# Patient Record
Sex: Male | Born: 1960 | Race: Black or African American | Hispanic: No | Smoking: Current every day smoker
Health system: Southern US, Community
[De-identification: ages and names within clinical notes are randomized; demographics above are authoritative.]

## PROBLEM LIST (undated history)

## (undated) HISTORY — PX: HAND SURGERY: SHX662

---

## 2020-05-24 DIAGNOSIS — K5720 Diverticulitis of large intestine with perforation and abscess without bleeding: Secondary | ICD-10-CM

## 2020-05-24 HISTORY — DX: Diverticulitis of large intestine with perforation and abscess without bleeding: K57.20

## 2020-06-03 ENCOUNTER — Encounter (HOSPITAL_COMMUNITY): Payer: Self-pay

## 2020-06-03 ENCOUNTER — Inpatient Hospital Stay (HOSPITAL_COMMUNITY)
Admission: EM | Admit: 2020-06-03 | Discharge: 2020-06-06 | DRG: 392 | Disposition: A | Discharge status: Home / Self Care | Payer: PRIVATE HEALTH INSURANCE | Attending: General Surgery | Admitting: General Surgery

## 2020-06-03 ENCOUNTER — Emergency Department (HOSPITAL_COMMUNITY): Payer: PRIVATE HEALTH INSURANCE

## 2020-06-03 ENCOUNTER — Inpatient Hospital Stay (HOSPITAL_COMMUNITY): Payer: PRIVATE HEALTH INSURANCE

## 2020-06-03 ENCOUNTER — Other Ambulatory Visit: Payer: Self-pay

## 2020-06-03 DIAGNOSIS — N179 Acute kidney failure, unspecified: Secondary | ICD-10-CM | POA: Diagnosis present

## 2020-06-03 DIAGNOSIS — K56600 Partial intestinal obstruction, unspecified as to cause: Secondary | ICD-10-CM | POA: Diagnosis present

## 2020-06-03 DIAGNOSIS — K630 Abscess of intestine: Secondary | ICD-10-CM

## 2020-06-03 DIAGNOSIS — Z20822 Contact with and (suspected) exposure to covid-19: Secondary | ICD-10-CM | POA: Diagnosis present

## 2020-06-03 DIAGNOSIS — K5720 Diverticulitis of large intestine with perforation and abscess without bleeding: Secondary | ICD-10-CM | POA: Diagnosis present

## 2020-06-03 DIAGNOSIS — F17210 Nicotine dependence, cigarettes, uncomplicated: Secondary | ICD-10-CM | POA: Diagnosis present

## 2020-06-03 LAB — URINALYSIS, MICROSCOPIC (REFLEX)

## 2020-06-03 LAB — URINALYSIS, ROUTINE W REFLEX MICROSCOPIC
Glucose, UA: 100 mg/dL — AB
Ketones, ur: NEGATIVE mg/dL
Leukocytes,Ua: NEGATIVE
Nitrite: NEGATIVE
Protein, ur: 100 mg/dL — AB
Specific Gravity, Urine: >1.03 — ABNORMAL HIGH (ref 1.005–1.030)
pH: 5 (ref 5.0–8.0)

## 2020-06-03 LAB — CBC
HCT: 49.6 % (ref 39.0–52.0)
Hemoglobin: 16.9 g/dL (ref 13.0–17.0)
MCH: 29.6 pg (ref 26.0–34.0)
MCHC: 34.1 g/dL (ref 30.0–36.0)
MCV: 87 fL (ref 80.0–100.0)
Platelets: 463 10*3/uL — ABNORMAL HIGH (ref 150–400)
RBC: 5.7 MIL/uL (ref 4.22–5.81)
RDW: 14.1 % (ref 11.5–15.5)
WBC: 11.2 10*3/uL — ABNORMAL HIGH (ref 4.0–10.5)
nRBC: 0 % (ref 0.0–0.2)

## 2020-06-03 LAB — SARS CORONAVIRUS 2 (TAT 6-24 HRS): SARS Coronavirus 2: NEGATIVE

## 2020-06-03 LAB — COMPREHENSIVE METABOLIC PANEL
ALT: 29 U/L (ref 0–44)
AST: 29 U/L (ref 15–41)
Albumin: 3.9 g/dL (ref 3.5–5.0)
Alkaline Phosphatase: 46 U/L (ref 38–126)
Anion gap: 18 — ABNORMAL HIGH (ref 5–15)
BUN: 39 mg/dL — ABNORMAL HIGH (ref 6–20)
CO2: 25 mmol/L (ref 22–32)
Calcium: 9.5 mg/dL (ref 8.9–10.3)
Chloride: 92 mmol/L — ABNORMAL LOW (ref 98–111)
Creatinine, Ser: 1.95 mg/dL — ABNORMAL HIGH (ref 0.61–1.24)
GFR, Estimated: 39 mL/min — ABNORMAL LOW (ref >60)
Glucose, Bld: 104 mg/dL — ABNORMAL HIGH (ref 70–99)
Potassium: 4.2 mmol/L (ref 3.5–5.1)
Sodium: 135 mmol/L (ref 135–145)
Total Bilirubin: 1 mg/dL (ref 0.3–1.2)
Total Protein: 8.5 g/dL — ABNORMAL HIGH (ref 6.5–8.1)

## 2020-06-03 LAB — LIPASE, BLOOD: Lipase: 26 U/L (ref 11–51)

## 2020-06-03 MED ORDER — ACETAMINOPHEN 325 MG PO TABS: 650.0000 mg | ORAL_TABLET | Freq: Four times a day (QID) | ORAL | Status: DC | PRN

## 2020-06-03 MED ORDER — PANTOPRAZOLE SODIUM 40 MG IV SOLR
40.0000 mg | Freq: Once | INTRAVENOUS | Status: AC
  Administered 2020-06-03: 40 mg via INTRAVENOUS
  Filled 2020-06-03: qty 40

## 2020-06-03 MED ORDER — PIPERACILLIN-TAZOBACTAM 3.375 G IVPB
3.3750 g | Freq: Three times a day (TID) | INTRAVENOUS | Status: DC
  Administered 2020-06-03 – 2020-06-06 (×8): 3.375 g via INTRAVENOUS
  Filled 2020-06-03 (×8): qty 50

## 2020-06-03 MED ORDER — ONDANSETRON HCL 4 MG/2ML IJ SOLN
4.0000 mg | Freq: Once | INTRAMUSCULAR | Status: AC
  Administered 2020-06-03: 4 mg via INTRAVENOUS
  Filled 2020-06-03: qty 2

## 2020-06-03 MED ORDER — ONDANSETRON HCL 4 MG/2ML IJ SOLN
4.0000 mg | Freq: Four times a day (QID) | INTRAMUSCULAR | Status: DC | PRN
  Administered 2020-06-04 – 2020-06-05 (×4): 4 mg via INTRAVENOUS
  Filled 2020-06-03 (×4): qty 2

## 2020-06-03 MED ORDER — SIMETHICONE 80 MG PO CHEW
40.0000 mg | CHEWABLE_TABLET | Freq: Four times a day (QID) | ORAL | Status: DC | PRN
  Filled 2020-06-03: qty 1

## 2020-06-03 MED ORDER — MORPHINE SULFATE (PF) 4 MG/ML IV SOLN
4.0000 mg | Freq: Once | INTRAVENOUS | Status: AC
Start: 2020-06-03 — End: 2020-06-03
  Administered 2020-06-03: 4 mg via INTRAVENOUS
  Filled 2020-06-03: qty 1

## 2020-06-03 MED ORDER — MIDAZOLAM HCL 2 MG/2ML IJ SOLN
4.0000 mg | Freq: Once | INTRAMUSCULAR | Status: AC
  Administered 2020-06-03: 4 mg via INTRAVENOUS
  Filled 2020-06-03: qty 4

## 2020-06-03 MED ORDER — SODIUM CHLORIDE 0.9 % IV SOLN: INTRAVENOUS | Status: DC

## 2020-06-03 MED ORDER — ACETAMINOPHEN 650 MG RE SUPP: 650.0000 mg | Freq: Four times a day (QID) | RECTAL | Status: DC | PRN

## 2020-06-03 MED ORDER — PIPERACILLIN-TAZOBACTAM 3.375 G IVPB 30 MIN
3.3750 g | Freq: Once | INTRAVENOUS | Status: AC
  Administered 2020-06-03: 3.375 g via INTRAVENOUS
  Filled 2020-06-03: qty 50

## 2020-06-03 MED ORDER — LIDOCAINE VISCOUS HCL 2 % MT SOLN
15.0000 mL | Freq: Once | OROMUCOSAL | Status: AC
  Administered 2020-06-03: 15 mL via OROMUCOSAL
  Filled 2020-06-03: qty 15

## 2020-06-03 MED ORDER — MORPHINE SULFATE (PF) 2 MG/ML IV SOLN
2.0000 mg | INTRAVENOUS | Status: DC | PRN
  Administered 2020-06-03 – 2020-06-05 (×5): 2 mg via INTRAVENOUS
  Filled 2020-06-03 (×6): qty 1

## 2020-06-03 MED ORDER — ONDANSETRON 4 MG PO TBDP: 4.0000 mg | ORAL_TABLET | Freq: Four times a day (QID) | ORAL | Status: DC | PRN

## 2020-06-03 MED ORDER — ENOXAPARIN SODIUM 40 MG/0.4ML ~~LOC~~ SOLN
40.0000 mg | SUBCUTANEOUS | Status: DC
  Administered 2020-06-04 – 2020-06-05 (×2): 40 mg via SUBCUTANEOUS
  Filled 2020-06-03 (×2): qty 0.4

## 2020-06-03 NOTE — ED Provider Notes (Addendum)
[facility] EMERGENCY DEPARTMENT Provider Note   CSN: [id number] Arrival date & time: 06/03/20  1220     History Chief Complaint  Patient presents with  . Abdominal Pain  . Nausea    William Boyd is a 60 y.o. male who presents for evaluation of abdominal pain, nausea/vomiting.  He reports that he has had nausea/vomiting and lower abdominal pain for the last few days.  The urgent care yesterday they diagnosed him with a UTI.  He was discharged home with Cipro and Zofran which he states he has been taking.  He reports he woke up this morning and was nauseous.  He states he had 3 times episodes of vomiting that looked to be like black coffee grounds.  He states that this is never happened to him before.  No bright red blood.  No melena.  He states his belly pain is improved but he states that he does feel nauseous.  He has never had an endoscopy or colonoscopy.  He does report that he drank alcohol 20 years ago but has not drink since then.  He smokes about a pack of cigarettes a day.  He does report that he frequently uses ibuprofen and states sometimes he will take it multiple days a week.  He has never been diagnosed with an ulcer and has not seen a GI doctor.  He states he has felt some subjective fevers but has not measured anything.  He has not had any chest pain, difficulty breathing, dysuria, hematuria.  The history is provided by the patient.       History reviewed. No pertinent past medical history.  Patient Active Problem List   Diagnosis Date Noted  . Diverticulitis of colon with perforation 06/03/2020    The histories are not reviewed yet. Please review them in the "History" navigator section and refresh this [place].     No family history on file.     Home Medications Prior to Admission medications   Not on File    Allergies    Patient has no allergy information on record.  Review of Systems   Review of Systems  Constitutional:  Positive for fever (subjective).  Respiratory: Negative for cough and shortness of breath.   Cardiovascular: Negative for chest pain.  Gastrointestinal: Positive for abdominal pain, nausea and vomiting.  Genitourinary: Negative for dysuria and hematuria.  Neurological: Negative for headaches.  All other systems reviewed and are negative.   Physical Exam Updated Vital Signs BP 110/82 (BP Location: Right Arm)   Pulse 82   Temp 98 F (36.7 C) (Oral)   Resp 17   Ht 5\' 9"  (1.753 m)   Wt 89.8 kg   SpO2 96%   BMI 29.24 kg/m   Physical Exam Vitals and nursing note reviewed.  Constitutional:      Appearance: Normal appearance. He is well-developed and well-nourished.  HENT:     Head: Normocephalic and atraumatic.     Mouth/Throat:     Mouth: Oropharynx is clear and moist and mucous membranes are normal.  Eyes:     General: Lids are normal.     Extraocular Movements: EOM normal.     Conjunctiva/sclera: Conjunctivae normal.     Pupils: Pupils are equal, round, and reactive to light.  Cardiovascular:     Rate and Rhythm: Normal rate and regular rhythm.     Pulses: Normal pulses.     Heart sounds: Normal heart sounds. No murmur heard. No friction rub. No  gallop.   Pulmonary:     Effort: Pulmonary effort is normal.     Breath sounds: Normal breath sounds.     Comments: Lungs clear to auscultation bilaterally.  Symmetric chest rise.  No wheezing, rales, rhonchi. Abdominal:     Palpations: Abdomen is soft. Abdomen is not rigid.     Tenderness: There is no abdominal tenderness. There is no guarding.     Comments: Abdomen is soft, non-distended, non-tender. No rigidity, No guarding. No peritoneal signs.  Musculoskeletal:        General: Normal range of motion.     Cervical back: Full passive range of motion without pain.  Skin:    General: Skin is warm and dry.     Capillary Refill: Capillary refill takes less than 2 seconds.  Neurological:     Mental Status: He is alert and  oriented to person, place, and time.  Psychiatric:        Mood and Affect: Mood and affect normal.        Speech: Speech normal.     ED Results / Procedures / Treatments   Labs (all labs ordered are listed, but only abnormal results are displayed) Labs Reviewed  COMPREHENSIVE METABOLIC PANEL - Abnormal; Notable for the following components:      Result Value   Chloride 92 (*)    Glucose, Bld 104 (*)    BUN 39 (*)    Creatinine, Ser 1.95 (*)    Total Protein 8.5 (*)    GFR, Estimated 39 (*)    Anion gap 18 (*)    All other components within normal limits  CBC - Abnormal; Notable for the following components:   WBC 11.2 (*)    Platelets 463 (*)    All other components within normal limits  URINALYSIS, ROUTINE W REFLEX MICROSCOPIC - Abnormal; Notable for the following components:   Specific Gravity, Urine >1.030 (*)    Glucose, UA 100 (*)    Hgb urine dipstick MODERATE (*)    Bilirubin Urine MODERATE (*)    Protein, ur 100 (*)    All other components within normal limits  URINALYSIS, MICROSCOPIC (REFLEX) - Abnormal; Notable for the following components:   Bacteria, UA FEW (*)    All other components within normal limits  SARS CORONAVIRUS 2 (TAT 6-24 HRS)  LIPASE, BLOOD  CBC  BASIC METABOLIC PANEL    EKG None  Radiology CT ABDOMEN PELVIS WO CONTRAST  Result Date: 06/03/2020 CLINICAL DATA:  Generalized abdominal pain with nausea vomiting for the past week. EXAM: CT ABDOMEN AND PELVIS WITHOUT CONTRAST TECHNIQUE: Multidetector CT imaging of the abdomen and pelvis was performed following the standard protocol without IV contrast. COMPARISON:  None. FINDINGS: Lower chest: Minor linear atelectasis.  Bases otherwise clear. Hepatobiliary: 8 mm low-density along the inferior margin of segment 6, consistent with a cyst. Smaller low-density lesion at the dome of segment 7, 4 mm, also likely a cyst. Third low-density lesion the dome of segment 8 again likely a cyst. Liver normal in  overall size. Generalized decreased attenuation of the liver suggesting fatty infiltration. Normal gallbladder. No bile duct dilation. Pancreas: Unremarkable. No pancreatic ductal dilatation or surrounding inflammatory changes. Spleen: Small, otherwise unremarkable spleen. Adrenals/Urinary Tract: No adrenal masses. Kidneys normal in overall size, orientation and position. 1 cm indeterminate mass protruding from the inferior margin of the left kidney. 2.4 cm low-density mass at the midpole of the right kidney consistent with a cyst. Smaller low-density mass along the  lateral lower pole of the right kidney, 1.9 cm, also likely a cyst. No renal stones. No hydronephrosis. Normal ureters. Bladder decompressed. Stomach/Bowel: Focal fluid collection in the left anterior upper pelvis containing non dependent air and dependent fluid, measuring 5.1 x 3.9 x 4.8 cm. Fluid collection connects with irregular stranding and hazy inflammatory change in the fat, associated with multiple small bubbles of extraluminal air, which extends to the adjacent proximal to mid sigmoid colon. Sigmoid colon is decompressed. There are scattered small diverticula. No wall thickening. No other inflammation. Remainder of the colon is normal in caliber. No wall thickening. No inflammation. Normal appendix visualized. Stomach is moderately distended and there is significant distension of the small bowel, maximum approximately 4.4 cm. Small bowel is distended to the level of the left anterior pelvic collection where it abruptly transitions to decompressed distal small bowel. Vascular/Lymphatic: Aortic atherosclerosis. No aneurysm. No enlarged lymph nodes. Reproductive: Prominent prostate, 4.6 x 3.9 cm transversely. Other: Trace amount of free intraperitoneal air most evident over the anterior margin of the liver. No ascites. Musculoskeletal: No fracture or acute finding.  No bone lesion. IMPRESSION: 1. High-grade partial small bowel obstruction due to  an apparent peridiverticular abscess in the left lower quadrant/left anterior upper pelvis. The abscess is contiguous with inflammation and bubbles of air that tract to the adjacent proximal to mid sigmoid colon, where there are several small diverticula. This collection is likely chronic, since air is no significant colonic wall thickening to suggest active diverticulitis. The collection measures 5.1 cm in greatest dimension. 2. Trace amount of free intraperitoneal air. 3. No other acute abnormality within the abdomen or pelvis. 4. Aortic atherosclerosis. Electronically Signed   By: Lajean Manes M.D.   On: 06/03/2020 16:10    Procedures Procedures   Medications Ordered in ED Medications  piperacillin-tazobactam (ZOSYN) IVPB 3.375 g (3.375 g Intravenous New Bag/Given 06/03/20 1806)  midazolam (VERSED) injection 4 mg (has no administration in time range)  enoxaparin (LOVENOX) injection 40 mg (has no administration in time range)  0.9 %  sodium chloride infusion (has no administration in time range)  acetaminophen (TYLENOL) tablet 650 mg (has no administration in time range)    Or  acetaminophen (TYLENOL) suppository 650 mg (has no administration in time range)  morphine 2 MG/ML injection 2 mg (has no administration in time range)  ondansetron (ZOFRAN-ODT) disintegrating tablet 4 mg (has no administration in time range)    Or  ondansetron (ZOFRAN) injection 4 mg (has no administration in time range)  simethicone (MYLICON) chewable tablet 40 mg (has no administration in time range)  ondansetron (ZOFRAN) injection 4 mg (4 mg Intravenous Given 06/03/20 1436)  pantoprazole (PROTONIX) injection 40 mg (40 mg Intravenous Given 06/03/20 1525)  morphine 4 MG/ML injection 4 mg (4 mg Intravenous Given 06/03/20 1525)    ED Course  I have reviewed the triage vital signs and the nursing notes.  Pertinent labs & imaging results that were available during my care of the patient were reviewed by me and  considered in my medical decision making (see chart for details).    MDM Rules/Calculators/A&P                          60 year old male who presents for evaluation of abdominal pain, nausea/vomiting.  Seen at urgent care yesterday and was treated for a UTI.  States that today, he started having vomiting.  He states that some of the episodes may have looked  like coffee-ground emesis.  On initial arrival, he is afebrile but tachycardic, appears slightly uncomfortable.  He has some mild generalized tenderness.  Will plan for labs, urine.  Consider infectious process versus pyelonephritis versus kidney stone also consider possible GI bleed given the coffee-ground emesis.   CMP shows BUN of 39, creatinine of 1.95. Patient does not know of any history of elevated Cr and none in the records for comparison. Will obtain CT without contrast for evaluation of obstructive pathology. Anion gap is 18.  CBC shows leukocytosis of 11.2.  Platelets of 463.  Lipase is unremarkable. UA shows moderate hgb, protein.   CT scan shows a small bowel obstruction due to an apparent type peridiverticular abscess in the left lower quadrant/left anterior upper pelvis.  Abscess is contiguous with inflammation of bubbles of air in the tract to the adjacent proximal sigmoid colon.  The collection is likely chronic and does not see any acute diverticulitis.  The collection measures about 5.1 cm.  Discussed patient with Dr. Donne Hazel (Gen Surg). He recommends starting patient on antibiotics, NG tube and medical admission. Gen surg will consult.   Discussed patient with Dr. Tana Coast (Hospitalist). She requests that I discuss with general surgery regarding admission.   Discussed patient with Dr. Donne Hazel (General surgery) who accepts patient for admission.   Patient having trouble tolerating NG tube. Will give versed for anxiety.   Portions of this note were generated with [employer]. Dictation errors may occur despite  best attempts at proofreading.   Final Clinical Impression(s) / ED Diagnoses Final diagnoses:  Partial small bowel obstruction [facility])  Intestinal diverticular abscess    Rx / DC Orders ED Discharge Orders    None       Volanda Napoleon, PA-C 06/03/20 1752    Volanda Napoleon, PA-C 06/03/20 1825    Wyvonnia Dusky, MD 06/03/20 [phone]

## 2020-06-03 NOTE — ED Notes (Addendum)
Resting comfortably.

## 2020-06-03 NOTE — H&P (Signed)
William Boyd is an 61 y.o. male.   Chief Complaint: ab pain HPI: 34 yom who is otherwise healthy presents with one week of ab pain in llq, n/v.  This started last Friday and he thought he had a pulled msucle.  He has no fevers. Nothing at home making it better. Never had csc. He smokes daily. He went to urgent care yesterday and diagnosed with uti but pain worsened and he came to er today.  He has not taken his temp but has had subjective fever.  He had a ct scan and appears to have perforated diverticulitis with resultant partial sbo. He has had a bm yesterday and passed some flatus today.    PMH none PSH right wrist surgery Meds none NKDA + 1ppd smoker   Results for orders placed or performed during the hospital encounter of 06/03/20 (from the past 48 hour(s))  Lipase, blood     Status: None   Collection Time: 06/03/20 12:36 PM  Result Value Ref Range   Lipase 26 11 - 51 U/L    Comment: Performed at [facility] [address], [place], [place] [address]  Comprehensive metabolic panel     Status: Abnormal   Collection Time: 06/03/20 12:36 PM  Result Value Ref Range   Sodium 135 135 - 145 mmol/L   Potassium 4.2 3.5 - 5.1 mmol/L   Chloride 92 (L) 98 - 111 mmol/L   CO2 25 22 - 32 mmol/L   Glucose, Bld 104 (H) 70 - 99 mg/dL    Comment: Glucose reference range applies only to samples taken after fasting for at least 8 hours.   BUN 39 (H) 6 - 20 mg/dL   Creatinine, Ser 1.95 (H) 0.61 - 1.24 mg/dL   Calcium 9.5 8.9 - 10.3 mg/dL   Total Protein 8.5 (H) 6.5 - 8.1 g/dL   Albumin 3.9 3.5 - 5.0 g/dL   AST 29 15 - 41 U/L   ALT 29 0 - 44 U/L   Alkaline Phosphatase 46 38 - 126 U/L   Total Bilirubin 1.0 0.3 - 1.2 mg/dL   GFR, Estimated 39 (L) >60 mL/min    Comment: (NOTE) Calculated using the CKD-EPI Creatinine Equation (2021)    Anion gap 18 (H) 5 - 15    Comment: Performed at [facility], [place] [address] [place], [place] [address]  CBC     Status: Abnormal    Collection Time: 06/03/20 12:36 PM  Result Value Ref Range   WBC 11.2 (H) 4.0 - 10.5 K/uL   RBC 5.70 4.22 - 5.81 MIL/uL   Hemoglobin 16.9 13.0 - 17.0 g/dL   HCT 49.6 39.0 - 52.0 %   MCV 87.0 80.0 - 100.0 fL   MCH 29.6 26.0 - 34.0 pg   MCHC 34.1 30.0 - 36.0 g/dL   RDW 14.1 11.5 - 15.5 %   Platelets 463 (H) 150 - 400 K/uL   nRBC 0.0 0.0 - 0.2 %    Comment: Performed at [facility] [address], [place], [place] [address]  Urinalysis, Routine w reflex microscopic Urine, Clean Catch     Status: Abnormal   Collection Time: 06/03/20  3:55 PM  Result Value Ref Range   Color, Urine YELLOW YELLOW   APPearance CLEAR CLEAR   Specific Gravity, Urine >1.030 (H) 1.005 - 1.030   pH 5.0 5.0 - 8.0   Glucose, UA 100 (A) NEGATIVE mg/dL   Hgb urine dipstick MODERATE (A)  NEGATIVE   Bilirubin Urine MODERATE (A) NEGATIVE   Ketones, ur NEGATIVE NEGATIVE mg/dL   Protein, ur 100 (A) NEGATIVE mg/dL   Nitrite NEGATIVE NEGATIVE   Leukocytes,Ua NEGATIVE NEGATIVE    Comment: Performed at [facility], [address] [place], [place] [address]  Urinalysis, Microscopic (reflex)     Status: Abnormal   Collection Time: 06/03/20  3:55 PM  Result Value Ref Range   RBC / HPF 6-10 0 - 5 RBC/hpf   WBC, UA 0-5 0 - 5 WBC/hpf   Bacteria, UA FEW (A) NONE SEEN   Squamous Epithelial / LPF 6-10 0 - 5   Ca Oxalate Crys, UA PRESENT     Comment: Performed at [facility] [address], [place], [place] [address]   CT ABDOMEN PELVIS WO CONTRAST  Result Date: 06/03/2020 CLINICAL DATA:  Generalized abdominal pain with nausea vomiting for the past week. EXAM: CT ABDOMEN AND PELVIS WITHOUT CONTRAST TECHNIQUE: Multidetector CT imaging of the abdomen and pelvis was performed following the standard protocol without IV contrast. COMPARISON:  None. FINDINGS: Lower chest: Minor linear atelectasis.  Bases otherwise clear. Hepatobiliary: 8 mm low-density along the inferior margin of segment 6, consistent with a  cyst. Smaller low-density lesion at the dome of segment 7, 4 mm, also likely a cyst. Third low-density lesion the dome of segment 8 again likely a cyst. Liver normal in overall size. Generalized decreased attenuation of the liver suggesting fatty infiltration. Normal gallbladder. No bile duct dilation. Pancreas: Unremarkable. No pancreatic ductal dilatation or surrounding inflammatory changes. Spleen: Small, otherwise unremarkable spleen. Adrenals/Urinary Tract: No adrenal masses. Kidneys normal in overall size, orientation and position. 1 cm indeterminate mass protruding from the inferior margin of the left kidney. 2.4 cm low-density mass at the midpole of the right kidney consistent with a cyst. Smaller low-density mass along the lateral lower pole of the right kidney, 1.9 cm, also likely a cyst. No renal stones. No hydronephrosis. Normal ureters. Bladder decompressed. Stomach/Bowel: Focal fluid collection in the left anterior upper pelvis containing non dependent air and dependent fluid, measuring 5.1 x 3.9 x 4.8 cm. Fluid collection connects with irregular stranding and hazy inflammatory change in the fat, associated with multiple small bubbles of extraluminal air, which extends to the adjacent proximal to mid sigmoid colon. Sigmoid colon is decompressed. There are scattered small diverticula. No wall thickening. No other inflammation. Remainder of the colon is normal in caliber. No wall thickening. No inflammation. Normal appendix visualized. Stomach is moderately distended and there is significant distension of the small bowel, maximum approximately 4.4 cm. Small bowel is distended to the level of the left anterior pelvic collection where it abruptly transitions to decompressed distal small bowel. Vascular/Lymphatic: Aortic atherosclerosis. No aneurysm. No enlarged lymph nodes. Reproductive: Prominent prostate, 4.6 x 3.9 cm transversely. Other: Trace amount of free intraperitoneal air most evident over the  anterior margin of the liver. No ascites. Musculoskeletal: No fracture or acute finding.  No bone lesion. IMPRESSION: 1. High-grade partial small bowel obstruction due to an apparent peridiverticular abscess in the left lower quadrant/left anterior upper pelvis. The abscess is contiguous with inflammation and bubbles of air that tract to the adjacent proximal to mid sigmoid colon, where there are several small diverticula. This collection is likely chronic, since air is no significant colonic wall thickening to suggest active diverticulitis. The collection measures 5.1 cm in greatest dimension. 2. Trace amount of free intraperitoneal air. 3. No other acute abnormality within the  abdomen or pelvis. 4. Aortic atherosclerosis. Electronically Signed   By: Lajean Manes M.D.   On: 06/03/2020 16:10    Review of Systems  Constitutional: Positive for fever.  Respiratory: Negative for cough and shortness of breath.   Gastrointestinal: Positive for abdominal pain, constipation, nausea and vomiting.  All other systems reviewed and are negative.   Blood pressure 110/82, pulse 82, temperature 98 F (36.7 C), temperature source Oral, resp. rate 17, height 5\' 9"  (1.753 m), weight 89.8 kg, SpO2 96 %. Physical Exam Constitutional:      General: He is not in acute distress.    Appearance: He is well-developed.  HENT:     Head: Normocephalic and atraumatic.     Mouth/Throat:     Mouth: Mucous membranes are moist.     Pharynx: Oropharynx is clear.  Eyes:     General: No scleral icterus.    Extraocular Movements: Extraocular movements intact.  Cardiovascular:     Rate and Rhythm: Normal rate and regular rhythm.  Pulmonary:     Effort: Pulmonary effort is normal.     Breath sounds: Normal breath sounds.  Abdominal:     General: Bowel sounds are decreased. There is distension.     Tenderness: There is abdominal tenderness in the left lower quadrant.     Hernia: No hernia is present.  Skin:    General:  Skin is warm and dry.     Capillary Refill: Capillary refill takes less than 2 seconds.  Neurological:     General: No focal deficit present.     Mental Status: He is alert.  Psychiatric:        Mood and Affect: Mood normal.        Behavior: Behavior normal.      Assessment/Plan Likely perforated diverticulitis with pSBO -I dont think he needs surgery at this point.  Discussed ng tube for n/v and SBO. Abx for diverticular disease. Can have IR look at that this in am also to see what options are. -hopefully can treat conservatively and not have surgery this admission. We discussed indications for surgery as well -lovenox, scds -hydrate and recheck creatinine in am  Rolm Bookbinder, MD 06/03/2020, 6:22 PM

## 2020-06-03 NOTE — ED Notes (Addendum)
NGT placed via PA, 15 fr, placement checked with stethoscope and cxr ordered, placed to low intermittent sx, 30cc dark green stomach contents returned

## 2020-06-03 NOTE — ED Notes (Signed)
Pt not able to tolertate NGT plcmt, MD notified, orders for versed received

## 2020-06-03 NOTE — ED Triage Notes (Signed)
Pt reports generalized abd pain with n/v for the past week. Seen at [facility] and given two rx that has improved his pain but pt still feeling nauseated. No medical hx

## 2020-06-03 NOTE — Progress Notes (Signed)
Pt has increased anxiety, fidgeting, reports "got to have a cigarette."  Pt agrees to try a nicotine patch if ordered by MD.  This RN made 6N staff Clarise Cruz aware.

## 2020-06-03 NOTE — ED Notes (Signed)
Irrigated NGT with 40cc NS and 20cc NS returned

## 2020-06-03 NOTE — Progress Notes (Signed)
Pharmacy Antibiotic Note  William Boyd is a 60 y.o. male admitted on 06/03/2020 with likely perforated diverticulitis. Pharmacy has been consulted for Zosyn dosing. WBC slightly elevated 11.2. Afebrile. Scr up 1.95 (baseline  unknown), CrCl 45 ml/min.  Plan: Zosyn 3.375g IV q8h (4 hour infusion).  Monitor renal function, cultures/sensitivities, clinical progression  Height: 5\' 9"  (175.3 cm) Weight: 89.8 kg (198 lb) IBW/kg (Calculated) : 70.7  Temp (24hrs), Avg:98.2 F (36.8 C), Min:97.8 F (36.6 C), Max:98.7 F (37.1 C)  Recent Labs  Lab 06/03/20 1236  WBC 11.2*  CREATININE 1.95*    Estimated Creatinine Clearance: 45.7 mL/min (A) (by C-G formula based on SCr of 1.95 mg/dL (H)).    Not on File  Antimicrobials this admission: Zosyn 2/11 >>   Dose adjustments this admission: N/A  Microbiology results: N/A  Richardine Service, PharmD, [place] PGY2 Cardiology Pharmacy Resident Phone: [phone] 06/03/2020  7:58 PM  Please check [email or web address] for unit-specific pharmacy phone numbers.

## 2020-06-04 ENCOUNTER — Inpatient Hospital Stay (HOSPITAL_COMMUNITY): Payer: PRIVATE HEALTH INSURANCE

## 2020-06-04 LAB — BASIC METABOLIC PANEL
Anion gap: 18 — ABNORMAL HIGH (ref 5–15)
BUN: 47 mg/dL — ABNORMAL HIGH (ref 6–20)
CO2: 23 mmol/L (ref 22–32)
Calcium: 8.9 mg/dL (ref 8.9–10.3)
Chloride: 94 mmol/L — ABNORMAL LOW (ref 98–111)
Creatinine, Ser: 2.15 mg/dL — ABNORMAL HIGH (ref 0.61–1.24)
GFR, Estimated: 35 mL/min — ABNORMAL LOW (ref >60)
Glucose, Bld: 98 mg/dL (ref 70–99)
Potassium: 3.9 mmol/L (ref 3.5–5.1)
Sodium: 135 mmol/L (ref 135–145)

## 2020-06-04 LAB — CBC
HCT: 46.6 % (ref 39.0–52.0)
Hemoglobin: 15.9 g/dL (ref 13.0–17.0)
MCH: 29.8 pg (ref 26.0–34.0)
MCHC: 34.1 g/dL (ref 30.0–36.0)
MCV: 87.3 fL (ref 80.0–100.0)
Platelets: 425 10*3/uL — ABNORMAL HIGH (ref 150–400)
RBC: 5.34 MIL/uL (ref 4.22–5.81)
RDW: 13.8 % (ref 11.5–15.5)
WBC: 9.1 10*3/uL (ref 4.0–10.5)
nRBC: 0 % (ref 0.0–0.2)

## 2020-06-04 MED ORDER — FENTANYL CITRATE (PF) 100 MCG/2ML IJ SOLN
INTRAMUSCULAR | Status: AC | PRN
  Administered 2020-06-04: 25 ug via INTRAVENOUS
  Administered 2020-06-04: 50 ug via INTRAVENOUS

## 2020-06-04 MED ORDER — DEXTROSE-NACL 5-0.9 % IV SOLN: INTRAVENOUS | Status: DC

## 2020-06-04 MED ORDER — SODIUM CHLORIDE 0.9% FLUSH
5.0000 mL | Freq: Three times a day (TID) | INTRAVENOUS | Status: DC
  Administered 2020-06-04 – 2020-06-06 (×7): 5 mL

## 2020-06-04 MED ORDER — NICOTINE 14 MG/24HR TD PT24
14.0000 mg | MEDICATED_PATCH | Freq: Every day | TRANSDERMAL | Status: DC
  Administered 2020-06-04 – 2020-06-06 (×3): 14 mg via TRANSDERMAL
  Filled 2020-06-04 (×3): qty 1

## 2020-06-04 MED ORDER — FENTANYL CITRATE (PF) 100 MCG/2ML IJ SOLN
INTRAMUSCULAR | Status: AC
  Filled 2020-06-04: qty 2

## 2020-06-04 MED ORDER — MIDAZOLAM HCL 2 MG/2ML IJ SOLN
INTRAMUSCULAR | Status: AC
  Filled 2020-06-04: qty 2

## 2020-06-04 MED ORDER — MIDAZOLAM HCL 2 MG/2ML IJ SOLN
INTRAMUSCULAR | Status: AC | PRN
Start: 2020-06-04 — End: 2020-06-04
  Administered 2020-06-04 (×2): 0.5 mg via INTRAVENOUS

## 2020-06-04 NOTE — Consult Note (Addendum)
Chief Complaint: peridiverticular abscess. Request is for peridiverticular abscess drain placement  Referring Physician(s): Dr. Jeanmarie Hubert  Supervising Physician: Daryll Brod  Patient Status: [facility] - In-pt  History of Present Illness: William Boyd is a 60 y.o. male moker. No relevant medical history. Presented to the ED [facility] with LLQ abdominal pain nausea and vomiting what was described as black X 1 week. Previously was seen in a Urgent care on 2.10.22 and diagnosed with a UTI. Patient was found to have a perforated diverticulitis with abscess and partial SBO. CT Abdomen Pelvis from 2.11.22 reads High-grade partial small bowel obstruction due to an apparent peridiverticular abscess in the left lower quadrant/left anterior upper pelvis. The abscess is contiguous with inflammation and bubbles of air that tract to the adjacent proximal to mid sigmoid colon, where there are several small diverticula. This collection is likely chronic, since air is no significant colonic wall thickening to suggest active diverticulitis. The collection measures 5.1 cm in greatest dimension. Surgery was consulted and recommend conservative treatment at this time. Team is requesting a prediverticular abscess drain placement.  Daughter at bedside. Currently without any significant complaints. Patient alert and laying in bed, calm and comfortable. Mr. Dente displeasure with the existing NG tube but reports that his abominal pain has improved.  Return precautions and treatment recommendations and follow-up discussed with the patient who is agreeable with the plan.  History reviewed. No pertinent past medical history.  Allergies: Patient has no allergy information on record.  Medications: Prior to Admission medications   Not on File     No family history on file.  Social History   Socioeconomic History  . Marital status: Legally Separated    Spouse name: Not on file  . Number of children:  Not on file  . Years of education: Not on file  . Highest education level: Not on file  Occupational History  . Not on file  Tobacco Use  . Smoking status: Not on file  . Smokeless tobacco: Not on file  Substance and Sexual Activity  . Alcohol use: Not on file  . Drug use: Not on file  . Sexual activity: Not on file  Other Topics Concern  . Not on file  Social History Narrative  . Not on file   Social Determinants of Health   Financial Resource Strain: Not on file  Food Insecurity: Not on file  Transportation Needs: Not on file  Physical Activity: Not on file  Stress: Not on file  Social Connections: Not on file     Review of Systems: A 12 point ROS discussed and pertinent positives are indicated in the HPI above.  All other systems are negative.  Review of Systems  Constitutional: Negative for fever.  HENT: Negative for congestion.        Irritation with NG tube  Respiratory: Negative for cough and shortness of breath.   Cardiovascular: Negative for chest pain.  Gastrointestinal: Negative for abdominal pain.  Neurological: Negative for headaches.  Psychiatric/Behavioral: Negative for behavioral problems and confusion.    Vital Signs: BP 115/75 (BP Location: Right Arm)   Pulse 71   Temp 98.2 F (36.8 C) (Oral)   Resp 17   Ht 5\' 9"  (1.753 m)   Wt 198 lb (89.8 kg)   SpO2 94%   BMI 29.24 kg/m   Physical Exam Vitals and nursing note reviewed.  Constitutional:      Appearance: He is well-developed and well-nourished.  HENT:  Head: Normocephalic.     Comments: NG tube in left nare. Cardiovascular:     Rate and Rhythm: Normal rate and regular rhythm.     Heart sounds: Normal heart sounds.  Pulmonary:     Effort: Pulmonary effort is normal.  Musculoskeletal:        General: Normal range of motion.     Cervical back: Normal range of motion.  Skin:    General: Skin is dry.  Neurological:     Mental Status: He is alert and oriented to person, place, and  time.  Psychiatric:        Mood and Affect: Mood and affect normal.     Imaging: CT ABDOMEN PELVIS WO CONTRAST  Result Date: 06/03/2020 CLINICAL DATA:  Generalized abdominal pain with nausea vomiting for the past week. EXAM: CT ABDOMEN AND PELVIS WITHOUT CONTRAST TECHNIQUE: Multidetector CT imaging of the abdomen and pelvis was performed following the standard protocol without IV contrast. COMPARISON:  None. FINDINGS: Lower chest: Minor linear atelectasis.  Bases otherwise clear. Hepatobiliary: 8 mm low-density along the inferior margin of segment 6, consistent with a cyst. Smaller low-density lesion at the dome of segment 7, 4 mm, also likely a cyst. Third low-density lesion the dome of segment 8 again likely a cyst. Liver normal in overall size. Generalized decreased attenuation of the liver suggesting fatty infiltration. Normal gallbladder. No bile duct dilation. Pancreas: Unremarkable. No pancreatic ductal dilatation or surrounding inflammatory changes. Spleen: Small, otherwise unremarkable spleen. Adrenals/Urinary Tract: No adrenal masses. Kidneys normal in overall size, orientation and position. 1 cm indeterminate mass protruding from the inferior margin of the left kidney. 2.4 cm low-density mass at the midpole of the right kidney consistent with a cyst. Smaller low-density mass along the lateral lower pole of the right kidney, 1.9 cm, also likely a cyst. No renal stones. No hydronephrosis. Normal ureters. Bladder decompressed. Stomach/Bowel: Focal fluid collection in the left anterior upper pelvis containing non dependent air and dependent fluid, measuring 5.1 x 3.9 x 4.8 cm. Fluid collection connects with irregular stranding and hazy inflammatory change in the fat, associated with multiple small bubbles of extraluminal air, which extends to the adjacent proximal to mid sigmoid colon. Sigmoid colon is decompressed. There are scattered small diverticula. No wall thickening. No other inflammation.  Remainder of the colon is normal in caliber. No wall thickening. No inflammation. Normal appendix visualized. Stomach is moderately distended and there is significant distension of the small bowel, maximum approximately 4.4 cm. Small bowel is distended to the level of the left anterior pelvic collection where it abruptly transitions to decompressed distal small bowel. Vascular/Lymphatic: Aortic atherosclerosis. No aneurysm. No enlarged lymph nodes. Reproductive: Prominent prostate, 4.6 x 3.9 cm transversely. Other: Trace amount of free intraperitoneal air most evident over the anterior margin of the liver. No ascites. Musculoskeletal: No fracture or acute finding.  No bone lesion. IMPRESSION: 1. High-grade partial small bowel obstruction due to an apparent peridiverticular abscess in the left lower quadrant/left anterior upper pelvis. The abscess is contiguous with inflammation and bubbles of air that tract to the adjacent proximal to mid sigmoid colon, where there are several small diverticula. This collection is likely chronic, since air is no significant colonic wall thickening to suggest active diverticulitis. The collection measures 5.1 cm in greatest dimension. 2. Trace amount of free intraperitoneal air. 3. No other acute abnormality within the abdomen or pelvis. 4. Aortic atherosclerosis. Electronically Signed   By: Lajean Manes M.D.   On: 06/03/2020 16:10  DG Abd Portable 1 View  Result Date: 06/03/2020 CLINICAL DATA:  NG tube placement. EXAM: PORTABLE ABDOMEN - 1 VIEW COMPARISON:  Current abdomen and pelvis CT. FINDINGS: Nasogastric tube projects the left upper quadrant within the proximal stomach. The side hole of the tube is superior to the included field of view. Dilated loops of small bowel are noted consistent with the high-grade obstruction noted on the current CT. IMPRESSION: Nasogastric tube tip projects in the proximal stomach. Electronically Signed   By: Lajean Manes M.D.   On: 06/03/2020  19:45    Labs:  CBC: Recent Labs    06/03/20 1236 06/04/20 0104  WBC 11.2* 9.1  HGB 16.9 15.9  HCT 49.6 46.6  PLT 463* 425*    COAGS: No results for input(s): INR, APTT in the last 8760 hours.  BMP: Recent Labs    06/03/20 1236 06/04/20 0104  NA 135 135  K 4.2 3.9  CL 92* 94*  CO2 25 23  GLUCOSE 104* 98  BUN 39* 47*  CALCIUM 9.5 8.9  CREATININE 1.95* 2.15*  GFRNONAA 39* 35*    LIVER FUNCTION TESTS: Recent Labs    06/03/20 1236  BILITOT 1.0  AST 29  ALT 29  ALKPHOS 46  PROT 8.5*  ALBUMIN 3.9    Assessment and Plan:  60 y.o. male inpatient. Smoker. No relevant medical history. [facility]ed [facility] the [facility] with LLQ abdominal pain nausea and vomiting what was described as black X 1 week. Previously was seen in a Urgent care on 2.10.22 and diagnosed with a UTI. Patient was found to have a perforated diverticulitis with abscess and partial SBO. CT Abdomen Pelvis from 2.11.22 reads High-grade partial small bowel obstruction due to an apparent peridiverticular abscess in the left lower quadrant/left anterior upper pelvis. The abscess is contiguous with inflammation and bubbles of air that tract to the adjacent proximal to mid sigmoid colon, where there are several small diverticula. This collection is likely chronic, since air is no significant colonic wall thickening to suggest active diverticulitis. The collection measures 5.1 cm in greatest dimension. Surgery was consulted and recommend conservative treatment at this time. Team is requesting a prediverticular abscess drain placement  WBC is 9.1 (down trending), BUN 47, Cr 2.15, total protein 8.5. Patient reports no allergies. NPO since midnight.  Risks and benefits discussed with the patient including bleeding, infection, damage to adjacent structures, bowel perforation/fistula connection, and sepsis.  All of the patient's questions were answered, patient is agreeable to proceed. Consent signed and in chart.  Thank  you for this interesting consult.  I greatly enjoyed meeting William Boyd and look forward to participating in their care.  A copy of this report was sent to the requesting provider on this date.  Electronically Signed: Jacqualine Mau, NP 06/04/2020, 9:31 AM   I spent a total of 40 Minutes    in face to face in clinical consultation, greater than 50% of which was counseling/coordinating care for peridiverticular abscess drain placement.

## 2020-06-04 NOTE — Progress Notes (Signed)
Patient ID: William Boyd, male   DOB: July 14, 1960, 60 y.o.   MRN: [id number] [facility] Surgery Progress Note:   * No surgery found *  Subjective: Mental status is clear.  Complaints NG tube is irritating throat. Objective: Vital signs in last 24 hours: Temp:  [97.8 F (36.6 C)-98.9 F (37.2 C)] 98.2 F (36.8 C) (02/12 0500) Pulse Rate:  [71-115] 71 (02/12 0500) Resp:  [14-18] 17 (02/12 0500) BP: (96-128)/(69-83) 115/75 (02/12 0500) SpO2:  [94 %-99 %] 94 % (02/12 0500) Weight:  [89.8 kg] 89.8 kg (02/11 2149)  Intake/Output from previous day: 02/11 0701 - 02/12 0700 In: 50 [IV Piggyback:50] Out: 700 [Urine:400; Emesis/NG output:200] Intake/Output this shift: No intake/output data recorded.  Physical Exam: Work of breathing is normal .  No flatus.  NG with dark green cannister full  Lab Results:  Results for orders placed or performed during the hospital encounter of 06/03/20 (from the past 48 hour(s))  Lipase, blood     Status: None   Collection Time: 06/03/20 12:36 PM  Result Value Ref Range   Lipase 26 11 - 51 U/L    Comment: Performed at [facility], [place] [address] [place], [place] [address]  Comprehensive metabolic panel     Status: Abnormal   Collection Time: 06/03/20 12:36 PM  Result Value Ref Range   Sodium 135 135 - 145 mmol/L   Potassium 4.2 3.5 - 5.1 mmol/L   Chloride 92 (L) 98 - 111 mmol/L   CO2 25 22 - 32 mmol/L   Glucose, Bld 104 (H) 70 - 99 mg/dL    Comment: Glucose reference range applies only to samples taken after fasting for at least 8 hours.   BUN 39 (H) 6 - 20 mg/dL   Creatinine, Ser 1.95 (H) 0.61 - 1.24 mg/dL   Calcium 9.5 8.9 - 10.3 mg/dL   Total Protein 8.5 (H) 6.5 - 8.1 g/dL   Albumin 3.9 3.5 - 5.0 g/dL   AST 29 15 - 41 U/L   ALT 29 0 - 44 U/L   Alkaline Phosphatase 46 38 - 126 U/L   Total Bilirubin 1.0 0.3 - 1.2 mg/dL   GFR, Estimated 39 (L) >60 mL/min    Comment: (NOTE) Calculated using the CKD-EPI Creatinine Equation  (2021)    Anion gap 18 (H) 5 - 15    Comment: Performed at [facility], [place] [address] [place], [place] [address]  CBC     Status: Abnormal   Collection Time: 06/03/20 12:36 PM  Result Value Ref Range   WBC 11.2 (H) 4.0 - 10.5 K/uL   RBC 5.70 4.22 - 5.81 MIL/uL   Hemoglobin 16.9 13.0 - 17.0 g/dL   HCT 49.6 39.0 - 52.0 %   MCV 87.0 80.0 - 100.0 fL   MCH 29.6 26.0 - 34.0 pg   MCHC 34.1 30.0 - 36.0 g/dL   RDW 14.1 11.5 - 15.5 %   Platelets 463 (H) 150 - 400 K/uL   nRBC 0.0 0.0 - 0.2 %    Comment: Performed at [facility] [address], [place], [place] [address]  Urinalysis, Routine w reflex microscopic Urine, Clean Catch     Status: Abnormal   Collection Time: 06/03/20  3:55 PM  Result Value Ref Range   Color, Urine YELLOW YELLOW   APPearance CLEAR CLEAR   Specific Gravity, Urine >1.030 (H) 1.005 - 1.030   pH 5.0 5.0 - 8.0   Glucose, UA  100 (A) NEGATIVE mg/dL   Hgb urine dipstick MODERATE (A) NEGATIVE   Bilirubin Urine MODERATE (A) NEGATIVE   Ketones, ur NEGATIVE NEGATIVE mg/dL   Protein, ur 100 (A) NEGATIVE mg/dL   Nitrite NEGATIVE NEGATIVE   Leukocytes,Ua NEGATIVE NEGATIVE    Comment: Performed at [facility] [address] [place], [place] [address]  Urinalysis, Microscopic (reflex)     Status: Abnormal   Collection Time: 06/03/20  3:55 PM  Result Value Ref Range   RBC / HPF 6-10 0 - 5 RBC/hpf   WBC, UA 0-5 0 - 5 WBC/hpf   Bacteria, UA FEW (A) NONE SEEN   Squamous Epithelial / LPF 6-10 0 - 5   Ca Oxalate Crys, UA PRESENT     Comment: Performed at [facility] [address] [place], [place] [address]  SARS CORONAVIRUS 2 (TAT 6-24 HRS) Nasopharyngeal Nasopharyngeal Swab     Status: None   Collection Time: 06/03/20  5:11 PM   Specimen: Nasopharyngeal Swab  Result Value Ref Range   SARS Coronavirus 2 NEGATIVE NEGATIVE    Comment: (NOTE) SARS-CoV-2 target nucleic acids are NOT DETECTED.  The SARS-CoV-2 RNA is generally detectable in  upper and lower respiratory specimens during the acute phase of infection. Negative results do not preclude SARS-CoV-2 infection, do not rule out co-infections with other pathogens, and should not be used as the sole basis for treatment or other patient management decisions. Negative results must be combined with clinical observations, patient history, and epidemiological information. The expected result is Negative.  Fact Sheet for Patients: SugarRoll.be  Fact Sheet for Healthcare Providers: https://www.woods-mathews.com/  This test is not yet approved or cleared by the [place] FDA and  has been authorized for detection and/or diagnosis of SARS-CoV-2 by FDA under an Emergency Use Authorization (EUA). This EUA will remain  in effect (meaning this test can be used) for the duration of the COVID-19 declaration under Se ction 564(b)(1) of the Act, 21 U.S.C. section 360bbb-3(b)(1), unless the authorization is terminated or revoked sooner.  Performed at [facility], [place] [address], [place], [place] [address]   CBC     Status: Abnormal   Collection Time: 06/04/20  1:04 AM  Result Value Ref Range   WBC 9.1 4.0 - 10.5 K/uL   RBC 5.34 4.22 - 5.81 MIL/uL   Hemoglobin 15.9 13.0 - 17.0 g/dL   HCT 46.6 39.0 - 52.0 %   MCV 87.3 80.0 - 100.0 fL   MCH 29.8 26.0 - 34.0 pg   MCHC 34.1 30.0 - 36.0 g/dL   RDW 13.8 11.5 - 15.5 %   Platelets 425 (H) 150 - 400 K/uL   nRBC 0.0 0.0 - 0.2 %    Comment: Performed at [facility], [place] [address], [place],  [address]  Basic metabolic panel     Status: Abnormal   Collection Time: 06/04/20  1:04 AM  Result Value Ref Range   Sodium 135 135 - 145 mmol/L   Potassium 3.9 3.5 - 5.1 mmol/L   Chloride 94 (L) 98 - 111 mmol/L   CO2 23 22 - 32 mmol/L   Glucose, Bld 98 70 - 99 mg/dL    Comment: Glucose reference range applies only to samples taken after fasting for at least 8 hours.   BUN 47  (H) 6 - 20 mg/dL   Creatinine, Ser 2.15 (H) 0.61 - 1.24 mg/dL   Calcium 8.9 8.9 - 10.3 mg/dL  GFR, Estimated 35 (L) >60 mL/min    Comment: (NOTE) Calculated using the CKD-EPI Creatinine Equation (2021)    Anion gap 18 (H) 5 - 15    Comment: Performed at [facility], [place] [address], [place], [place] [address]    Radiology/Results: CT ABDOMEN PELVIS WO CONTRAST  Result Date: 06/03/2020 CLINICAL DATA:  Generalized abdominal pain with nausea vomiting for the past week. EXAM: CT ABDOMEN AND PELVIS WITHOUT CONTRAST TECHNIQUE: Multidetector CT imaging of the abdomen and pelvis was performed following the standard protocol without IV contrast. COMPARISON:  None. FINDINGS: Lower chest: Minor linear atelectasis.  Bases otherwise clear. Hepatobiliary: 8 mm low-density along the inferior margin of segment 6, consistent with a cyst. Smaller low-density lesion at the dome of segment 7, 4 mm, also likely a cyst. Third low-density lesion the dome of segment 8 again likely a cyst. Liver normal in overall size. Generalized decreased attenuation of the liver suggesting fatty infiltration. Normal gallbladder. No bile duct dilation. Pancreas: Unremarkable. No pancreatic ductal dilatation or surrounding inflammatory changes. Spleen: Small, otherwise unremarkable spleen. Adrenals/Urinary Tract: No adrenal masses. Kidneys normal in overall size, orientation and position. 1 cm indeterminate mass protruding from the inferior margin of the left kidney. 2.4 cm low-density mass at the midpole of the right kidney consistent with a cyst. Smaller low-density mass along the lateral lower pole of the right kidney, 1.9 cm, also likely a cyst. No renal stones. No hydronephrosis. Normal ureters. Bladder decompressed. Stomach/Bowel: Focal fluid collection in the left anterior upper pelvis containing non dependent air and dependent fluid, measuring 5.1 x 3.9 x 4.8 cm. Fluid collection connects with irregular stranding and hazy  inflammatory change in the fat, associated with multiple small bubbles of extraluminal air, which extends to the adjacent proximal to mid sigmoid colon. Sigmoid colon is decompressed. There are scattered small diverticula. No wall thickening. No other inflammation. Remainder of the colon is normal in caliber. No wall thickening. No inflammation. Normal appendix visualized. Stomach is moderately distended and there is significant distension of the small bowel, maximum approximately 4.4 cm. Small bowel is distended to the level of the left anterior pelvic collection where it abruptly transitions to decompressed distal small bowel. Vascular/Lymphatic: Aortic atherosclerosis. No aneurysm. No enlarged lymph nodes. Reproductive: Prominent prostate, 4.6 x 3.9 cm transversely. Other: Trace amount of free intraperitoneal air most evident over the anterior margin of the liver. No ascites. Musculoskeletal: No fracture or acute finding.  No bone lesion. IMPRESSION: 1. High-grade partial small bowel obstruction due to an apparent peridiverticular abscess in the left lower quadrant/left anterior upper pelvis. The abscess is contiguous with inflammation and bubbles of air that tract to the adjacent proximal to mid sigmoid colon, where there are several small diverticula. This collection is likely chronic, since air is no significant colonic wall thickening to suggest active diverticulitis. The collection measures 5.1 cm in greatest dimension. 2. Trace amount of free intraperitoneal air. 3. No other acute abnormality within the abdomen or pelvis. 4. Aortic atherosclerosis. Electronically Signed   By: Lajean Manes M.D.   On: 06/03/2020 16:10   DG Abd Portable 1 View  Result Date: 06/03/2020 CLINICAL DATA:  NG tube placement. EXAM: PORTABLE ABDOMEN - 1 VIEW COMPARISON:  Current abdomen and pelvis CT. FINDINGS: Nasogastric tube projects the left upper quadrant within the proximal stomach. The side hole of the tube is superior to  the included field of view. Dilated loops of small bowel are noted consistent with the high-grade obstruction  noted on the current CT. IMPRESSION: Nasogastric tube tip projects in the proximal stomach. Electronically Signed   By: Lajean Manes M.D.   On: 06/03/2020 19:45    Anti-infectives: Anti-infectives (From admission, onward)   Start     Dose/Rate Route Frequency Ordered Stop   06/03/20 2330  piperacillin-tazobactam (ZOSYN) IVPB 3.375 g        3.375 g 12.5 mL/hr over 240 Minutes Intravenous Every 8 hours 06/03/20 1837     06/03/20 1700  piperacillin-tazobactam (ZOSYN) IVPB 3.375 g        3.375 g 100 mL/hr over 30 Minutes Intravenous  Once 06/03/20 1652 06/03/20 1836      Assessment/Plan: Problem List: Patient Active Problem List   Diagnosis Date Noted  . Diverticulitis of colon with perforation 06/03/2020    Diverticulitis with partial SBO;  Antibiotics and NG with observation.  * No surgery found *    LOS: 1 day   Matt B. Hassell Done, MD, [facility] [phone] to reach the surgeon on call.    06/04/2020 8:38 AM

## 2020-06-04 NOTE — Procedures (Signed)
Interventional Radiology Procedure Note  Procedure: CT LLQ ABSCESS DRAIN    Complications: None  Estimated Blood Loss:  0  Findings: 15CC PUS ASP AND SENT FOR CX    Tamera Punt, MD

## 2020-06-05 NOTE — Plan of Care (Signed)
  Problem: Activity: Goal: Risk for activity intolerance will decrease Outcome: Progressing   Problem: Nutrition: Goal: Adequate nutrition will be maintained Outcome: Progressing   Problem: Elimination: Goal: Will not experience complications related to bowel motility Outcome: Progressing   Problem: Pain Managment: Goal: General experience of comfort will improve Outcome: Progressing   

## 2020-06-05 NOTE — Progress Notes (Signed)
Referring Physician(s): Dr. Jeanmarie Hubert  Supervising Physician: Daryll Brod  Patient Status:  [facility] - In-pt  Chief Complaint:  LLQ abdominal pain with nausea and vomiting s/p LLQ abscess drain placed on 2.12.22 by Dr. Annamaria Boots  Subjective:  William Boyd is alert and oriented. Reporting near resolution of abdominal pain. He voices frustration with the NG tube that is currently in place and would like to know when it can be removed.  Allergies: Patient has no known allergies.  Medications: Prior to Admission medications   Medication Sig Start Date End Date Taking? Authorizing Provider  ciprofloxacin (CIPRO) 500 MG tablet Take 500 mg by mouth 2 (two) times daily.   Yes [provider]  ibuprofen (ADVIL) 200 MG tablet Take 400 mg by mouth every 6 (six) hours as needed for headache (pain).   Yes [provider]  Multiple Vitamin (MULTIVITAMIN WITH MINERALS) TABS tablet Take 1 tablet by mouth daily.   Yes [provider]  Multiple Vitamins-Minerals (AIRBORNE+PROBIOTIC) CHEW Chew 3 tablets by mouth daily.   Yes [provider]  ondansetron (ZOFRAN-ODT) 4 MG disintegrating tablet Take 4 mg by mouth every 8 (eight) hours as needed for nausea or vomiting.   Yes [provider]     Vital Signs: BP 107/71 (BP Location: Right Arm)   Pulse 69   Temp 97.9 F (36.6 C) (Oral)   Resp 18   Ht 5\' 9"  (1.753 m)   Wt 198 lb (89.8 kg)   SpO2 94%   BMI 29.24 kg/m   Physical Exam Vitals and nursing note reviewed.  Constitutional:      Appearance: He is well-developed and well-nourished.  HENT:     Head: Normocephalic.     Comments: NG tube noted. Pulmonary:     Effort: Pulmonary effort is normal.  Abdominal:     Comments: Positive LLQ abscess drain to suction. Site is unremarkable with no erythema, edema, tenderness, bleeding or drainage noted at exit site.. Dressing is clean dry and intact. 3 ml of  serosanginous colored fluid noted in bulb  suction device. Drain is able to be flushed easily.   Musculoskeletal:        General: Normal range of motion.     Cervical back: Normal range of motion.  Skin:    General: Skin is dry.  Neurological:     Mental Status: He is alert and oriented to person, place, and time.  Psychiatric:        Mood and Affect: Mood and affect normal.     Imaging: CT ABDOMEN PELVIS WO CONTRAST  Result Date: 06/03/2020 CLINICAL DATA:  Generalized abdominal pain with nausea vomiting for the past week. EXAM: CT ABDOMEN AND PELVIS WITHOUT CONTRAST TECHNIQUE: Multidetector CT imaging of the abdomen and pelvis was performed following the standard protocol without IV contrast. COMPARISON:  None. FINDINGS: Lower chest: Minor linear atelectasis.  Bases otherwise clear. Hepatobiliary: 8 mm low-density along the inferior margin of segment 6, consistent with a cyst. Smaller low-density lesion at the dome of segment 7, 4 mm, also likely a cyst. Third low-density lesion the dome of segment 8 again likely a cyst. Liver normal in overall size. Generalized decreased attenuation of the liver suggesting fatty infiltration. Normal gallbladder. No bile duct dilation. Pancreas: Unremarkable. No pancreatic ductal dilatation or surrounding inflammatory changes. Spleen: Small, otherwise unremarkable spleen. Adrenals/Urinary Tract: No adrenal masses. Kidneys normal in overall size, orientation and position. 1 cm indeterminate mass protruding from the inferior margin of the  left kidney. 2.4 cm low-density mass at the midpole of the right kidney consistent with a cyst. Smaller low-density mass along the lateral lower pole of the right kidney, 1.9 cm, also likely a cyst. No renal stones. No hydronephrosis. Normal ureters. Bladder decompressed. Stomach/Bowel: Focal fluid collection in the left anterior upper pelvis containing non dependent air and dependent fluid, measuring 5.1 x 3.9 x 4.8 cm. Fluid collection connects with irregular stranding and  hazy inflammatory change in the fat, associated with multiple small bubbles of extraluminal air, which extends to the adjacent proximal to mid sigmoid colon. Sigmoid colon is decompressed. There are scattered small diverticula. No wall thickening. No other inflammation. Remainder of the colon is normal in caliber. No wall thickening. No inflammation. Normal appendix visualized. Stomach is moderately distended and there is significant distension of the small bowel, maximum approximately 4.4 cm. Small bowel is distended to the level of the left anterior pelvic collection where it abruptly transitions to decompressed distal small bowel. Vascular/Lymphatic: Aortic atherosclerosis. No aneurysm. No enlarged lymph nodes. Reproductive: Prominent prostate, 4.6 x 3.9 cm transversely. Other: Trace amount of free intraperitoneal air most evident over the anterior margin of the liver. No ascites. Musculoskeletal: No fracture or acute finding.  No bone lesion. IMPRESSION: 1. High-grade partial small bowel obstruction due to an apparent peridiverticular abscess in the left lower quadrant/left anterior upper pelvis. The abscess is contiguous with inflammation and bubbles of air that tract to the adjacent proximal to mid sigmoid colon, where there are several small diverticula. This collection is likely chronic, since air is no significant colonic wall thickening to suggest active diverticulitis. The collection measures 5.1 cm in greatest dimension. 2. Trace amount of free intraperitoneal air. 3. No other acute abnormality within the abdomen or pelvis. 4. Aortic atherosclerosis. Electronically Signed   By: Lajean Manes M.D.   On: 06/03/2020 16:10   DG Abd Portable 1 View  Result Date: 06/03/2020 CLINICAL DATA:  NG tube placement. EXAM: PORTABLE ABDOMEN - 1 VIEW COMPARISON:  Current abdomen and pelvis CT. FINDINGS: Nasogastric tube projects the left upper quadrant within the proximal stomach. The side hole of the tube is  superior to the included field of view. Dilated loops of small bowel are noted consistent with the high-grade obstruction noted on the current CT. IMPRESSION: Nasogastric tube tip projects in the proximal stomach. Electronically Signed   By: Lajean Manes M.D.   On: 06/03/2020 19:45   CT IMAGE GUIDED DRAINAGE BY PERCUTANEOUS CATHETER  Result Date: 06/04/2020 INDICATION: Left lower quadrant diverticular abscess EXAM: CT-GUIDED LEFT LOWER QUADRANT ABSCESS DRAIN MEDICATIONS: The patient is currently admitted to the hospital and receiving intravenous antibiotics. The antibiotics were administered within an appropriate time frame prior to the initiation of the procedure. ANESTHESIA/SEDATION: Fentanyl 100 mcg IV; Versed 1 mg IV Moderate Sedation Time:  10 MINUTES The patient was continuously monitored during the procedure by the interventional radiology nurse under my direct supervision. COMPLICATIONS: None immediate. PROCEDURE: Informed written consent was obtained from the patient after a thorough discussion of the procedural risks, benefits and alternatives. All questions were addressed. Maximal Sterile Barrier Technique was utilized including caps, mask, sterile gowns, sterile gloves, sterile drape, hand hygiene and skin antiseptic. A timeout was performed prior to the initiation of the procedure. Previous imaging reviewed. Patient positioned supine. Noncontrast localization CT performed. The left lower quadrant abscess was localized and marked for an anterior approach. Under sterile conditions and local anesthesia, an 18 gauge needle was introduced from an  anterior approach into the abscess. Needle position confirmed with CT. Syringe aspiration yielded purulent fluid. Guidewire advanced and confirmed in the abscess with CT. Tract dilatation performed to insert a 10 [place] drain. Drain catheter position confirmed with CT as well. Syringe aspiration yielded 15 cc purulent fluid. Sample sent for culture. Catheter  secured with a Prolene suture and connected to external suction bulb. Sterile dressing applied. No immediate complication. Patient tolerated the procedure well. IMPRESSION: Successful CT-guided left lower quadrant abscess drain placement. Electronically Signed   By: Jerilynn Mages.  Shick M.D.   On: 06/04/2020 15:12    Labs:  CBC: Recent Labs    06/03/20 1236 06/04/20 0104  WBC 11.2* 9.1  HGB 16.9 15.9  HCT 49.6 46.6  PLT 463* 425*    COAGS: No results for input(s): INR, APTT in the last 8760 hours.  BMP: Recent Labs    06/03/20 1236 06/04/20 0104  NA 135 135  K 4.2 3.9  CL 92* 94*  CO2 25 23  GLUCOSE 104* 98  BUN 39* 47*  CALCIUM 9.5 8.9  CREATININE 1.95* 2.15*  GFRNONAA 39* 35*    LIVER FUNCTION TESTS: Recent Labs    06/03/20 1236  BILITOT 1.0  AST 29  ALT 29  ALKPHOS 46  PROT 8.5*  ALBUMIN 3.9    Assessment and Plan:  60 y.o. male Smoker. No relevant medical history. Presented to the [facility] with LLQ abdominal pain nausea and vomiting what was described as black X 1 week. Previously was seen in a Urgent care on 2.10.22 and diagnosed with a UTI. Patient was found to have a perforated diverticulitis with abscess and partial SBO. CT Abdomen Pelvis from 2.11.22 reads High-grade partial small bowel obstruction due to an apparent peri diverticular abscess in the left lower quadrant/left anterior upper pelvis. The abscess is contiguous with inflammation and bubbles of air that tract to the adjacent proximal to mid sigmoid colon, where there are several small diverticula. This collection is likely chronic, since air is no significant colonic wall thickening to suggest active diverticulitis. The collection measures 5.1 cm in greatest dimension. Surgery was consulted and recommend conservative treatment at this time. On 2.12.22 IR placed a LLQ abscess drain with aspiration of fluid.   3 ml of serosanguinous fluid noted in the JP at time of evaluation. 32 ml of output per Epic.No  leukocytosis, BUN 47, Cr 2.15. Gram stain shows abundant WBC with gram negative rods. Culture pending. Patient is afebrile.   Recommend team continue with flushing TID, output recording q shift and dressing changes as needed. Would consider additional imaging when output is less than 10 ml for 24 hours not including flush material.   Continue current treatment plans as per [facility] and primary Team.  Electronically Signed: Jacqualine Mau, NP 06/05/2020, 10:25 AM   I spent a total of 15 Minutes at the patient's bedside AND on the patient's hospital floor or unit, greater than 50% of which was counseling/coordinating care for LLQ abscess drain

## 2020-06-05 NOTE — Progress Notes (Signed)
Patient ID: William Boyd, male   DOB: [birth year], 60 y.o.   MRN: [id number]  [facility] Surgery Progress Note:   * No surgery found *  Subjective: Mental status is alert.  Complaints feelilng much better and minimal complaints. Objective: Vital signs in last 24 hours: Temp:  [97.4 F (36.3 C)-98.1 F (36.7 C)] 97.9 F (36.6 C) (02/13 0440) Pulse Rate:  [57-78] 69 (02/13 0440) Resp:  [12-18] 18 (02/13 0440) BP: (102-116)/(67-80) 107/71 (02/13 0440) SpO2:  [92 %-98 %] 94 % (02/13 0440)  Intake/Output from previous day: 02/12 0701 - 02/13 0700 In: 2927.4 [P.O.:50; I.V.:2764.9; IV Piggyback:102.5] Out: 2407 [Urine:825; Emesis/NG output:1550; Drains:32] Intake/Output this shift: Total I/O In: -  Out: 375 [Urine:375]  Physical Exam: Work of breathing is normal.  Perc drain in place.  + flatus.     Lab Results:  Results for orders placed or performed during the hospital encounter of 06/03/20 (from the past 48 hour(s))  Lipase, blood     Status: None   Collection Time: 06/03/20 12:36 PM  Result Value Ref Range   Lipase 26 11 - 51 U/L    Comment: Performed at [facility], [place] [address] [place], [place] [address]  Comprehensive metabolic panel     Status: Abnormal   Collection Time: 06/03/20 12:36 PM  Result Value Ref Range   Sodium 135 135 - 145 mmol/L   Potassium 4.2 3.5 - 5.1 mmol/L   Chloride 92 (L) 98 - 111 mmol/L   CO2 25 22 - 32 mmol/L   Glucose, Bld 104 (H) 70 - 99 mg/dL    Comment: Glucose reference range applies only to samples taken after fasting for at least 8 hours.   BUN 39 (H) 6 - 20 mg/dL   Creatinine, Ser 1.95 (H) 0.61 - 1.24 mg/dL   Calcium 9.5 8.9 - 10.3 mg/dL   Total Protein 8.5 (H) 6.5 - 8.1 g/dL   Albumin 3.9 3.5 - 5.0 g/dL   AST 29 15 - 41 U/L   ALT 29 0 - 44 U/L   Alkaline Phosphatase 46 38 - 126 U/L   Total Bilirubin 1.0 0.3 - 1.2 mg/dL   GFR, Estimated 39 (L) >60 mL/min    Comment: (NOTE) Calculated using the CKD-EPI  Creatinine Equation (2021)    Anion gap 18 (H) 5 - 15    Comment: Performed at [facility], [place] [address] [place], [place] [address]  CBC     Status: Abnormal   Collection Time: 06/03/20 12:36 PM  Result Value Ref Range   WBC 11.2 (H) 4.0 - 10.5 K/uL   RBC 5.70 4.22 - 5.81 MIL/uL   Hemoglobin 16.9 13.0 - 17.0 g/dL   HCT 49.6 39.0 - 52.0 %   MCV 87.0 80.0 - 100.0 fL   MCH 29.6 26.0 - 34.0 pg   MCHC 34.1 30.0 - 36.0 g/dL   RDW 14.1 11.5 - 15.5 %   Platelets 463 (H) 150 - 400 K/uL   nRBC 0.0 0.0 - 0.2 %    Comment: Performed at [facility] [address], [place], [place] [address]  Urinalysis, Routine w reflex microscopic Urine, Clean Catch     Status: Abnormal   Collection Time: 06/03/20  3:55 PM  Result Value Ref Range   Color, Urine YELLOW YELLOW   APPearance CLEAR CLEAR   Specific Gravity, Urine >1.030 (H) 1.005 - 1.030   pH 5.0 5.0 - 8.0   Glucose,  UA 100 (A) NEGATIVE mg/dL   Hgb urine dipstick MODERATE (A) NEGATIVE   Bilirubin Urine MODERATE (A) NEGATIVE   Ketones, ur NEGATIVE NEGATIVE mg/dL   Protein, ur 100 (A) NEGATIVE mg/dL   Nitrite NEGATIVE NEGATIVE   Leukocytes,Ua NEGATIVE NEGATIVE    Comment: Performed at [facility] [address], [place], [place] [address]  Urinalysis, Microscopic (reflex)     Status: Abnormal   Collection Time: 06/03/20  3:55 PM  Result Value Ref Range   RBC / HPF 6-10 0 - 5 RBC/hpf   WBC, UA 0-5 0 - 5 WBC/hpf   Bacteria, UA FEW (A) NONE SEEN   Squamous Epithelial / LPF 6-10 0 - 5   Ca Oxalate Crys, UA PRESENT     Comment: Performed at [facility] [address], [place], [place] [address]  SARS CORONAVIRUS 2 (TAT 6-24 HRS) Nasopharyngeal Nasopharyngeal Swab     Status: None   Collection Time: 06/03/20  5:11 PM   Specimen: Nasopharyngeal Swab  Result Value Ref Range   SARS Coronavirus 2 NEGATIVE NEGATIVE    Comment: (NOTE) SARS-CoV-2 target nucleic acids are NOT DETECTED.  The SARS-CoV-2 RNA is  generally detectable in upper and lower respiratory specimens during the acute phase of infection. Negative results do not preclude SARS-CoV-2 infection, do not rule out co-infections with other pathogens, and should not be used as the sole basis for treatment or other patient management decisions. Negative results must be combined with clinical observations, patient history, and epidemiological information. The expected result is Negative.  Fact Sheet for Patients: SugarRoll.be  Fact Sheet for Healthcare Providers: https://www.woods-mathews.com/  This test is not yet approved or cleared by the [place] FDA and  has been authorized for detection and/or diagnosis of SARS-CoV-2 by FDA under an Emergency Use Authorization (EUA). This EUA will remain  in effect (meaning this test can be used) for the duration of the COVID-19 declaration under Se ction 564(b)(1) of the Act, 21 U.S.C. section 360bbb-3(b)(1), unless the authorization is terminated or revoked sooner.  Performed at [facility], [place] [address] [place], [place] [address]   CBC     Status: Abnormal   Collection Time: 06/04/20  1:04 AM  Result Value Ref Range   WBC 9.1 4.0 - 10.5 K/uL   RBC 5.34 4.22 - 5.81 MIL/uL   Hemoglobin 15.9 13.0 - 17.0 g/dL   HCT 46.6 39.0 - 52.0 %   MCV 87.3 80.0 - 100.0 fL   MCH 29.8 26.0 - 34.0 pg   MCHC 34.1 30.0 - 36.0 g/dL   RDW 13.8 11.5 - 15.5 %   Platelets 425 (H) 150 - 400 K/uL   nRBC 0.0 0.0 - 0.2 %    Comment: Performed at [facility], [place] [address] [place], [place] [address]  Basic metabolic panel     Status: Abnormal   Collection Time: 06/04/20  1:04 AM  Result Value Ref Range   Sodium 135 135 - 145 mmol/L   Potassium 3.9 3.5 - 5.1 mmol/L   Chloride 94 (L) 98 - 111 mmol/L   CO2 23 22 - 32 mmol/L   Glucose, Bld 98 70 - 99 mg/dL    Comment: Glucose reference range applies only to samples taken after fasting for at  least 8 hours.   BUN 47 (H) 6 - 20 mg/dL   Creatinine, Ser 2.15 (H) 0.61 - 1.24 mg/dL   Calcium 8.9 8.9 - 10.3 mg/dL  GFR, Estimated 35 (L) >60 mL/min    Comment: (NOTE) Calculated using the CKD-EPI Creatinine Equation (2021)    Anion gap 18 (H) 5 - 15    Comment: Performed at [facility], [place] [address], [place], [place] [address]  Aerobic/Anaerobic Culture (surgical/deep wound)     Status: None (Preliminary result)   Collection Time: 06/04/20  3:14 PM   Specimen: Abscess  Result Value Ref Range   Specimen Description ABSCESS    Special Requests ABDOMEN    Gram Stain      ABUNDANT WBC PRESENT, PREDOMINANTLY PMN ABUNDANT GRAM NEGATIVE RODS RARE GRAM POSITIVE RODS Performed at [facility], [place] [address] [place], [place] [address]    Culture PENDING    Report Status PENDING     Radiology/Results: CT ABDOMEN PELVIS WO CONTRAST  Result Date: 06/03/2020 CLINICAL DATA:  Generalized abdominal pain with nausea vomiting for the past week. EXAM: CT ABDOMEN AND PELVIS WITHOUT CONTRAST TECHNIQUE: Multidetector CT imaging of the abdomen and pelvis was performed following the standard protocol without IV contrast. COMPARISON:  None. FINDINGS: Lower chest: Minor linear atelectasis.  Bases otherwise clear. Hepatobiliary: 8 mm low-density along the inferior margin of segment 6, consistent with a cyst. Smaller low-density lesion at the dome of segment 7, 4 mm, also likely a cyst. Third low-density lesion the dome of segment 8 again likely a cyst. Liver normal in overall size. Generalized decreased attenuation of the liver suggesting fatty infiltration. Normal gallbladder. No bile duct dilation. Pancreas: Unremarkable. No pancreatic ductal dilatation or surrounding inflammatory changes. Spleen: Small, otherwise unremarkable spleen. Adrenals/Urinary Tract: No adrenal masses. Kidneys normal in overall size, orientation and position. 1 cm indeterminate mass protruding from the inferior margin  of the left kidney. 2.4 cm low-density mass at the midpole of the right kidney consistent with a cyst. Smaller low-density mass along the lateral lower pole of the right kidney, 1.9 cm, also likely a cyst. No renal stones. No hydronephrosis. Normal ureters. Bladder decompressed. Stomach/Bowel: Focal fluid collection in the left anterior upper pelvis containing non dependent air and dependent fluid, measuring 5.1 x 3.9 x 4.8 cm. Fluid collection connects with irregular stranding and hazy inflammatory change in the fat, associated with multiple small bubbles of extraluminal air, which extends to the adjacent proximal to mid sigmoid colon. Sigmoid colon is decompressed. There are scattered small diverticula. No wall thickening. No other inflammation. Remainder of the colon is normal in caliber. No wall thickening. No inflammation. Normal appendix visualized. Stomach is moderately distended and there is significant distension of the small bowel, maximum approximately 4.4 cm. Small bowel is distended to the level of the left anterior pelvic collection where it abruptly transitions to decompressed distal small bowel. Vascular/Lymphatic: Aortic atherosclerosis. No aneurysm. No enlarged lymph nodes. Reproductive: Prominent prostate, 4.6 x 3.9 cm transversely. Other: Trace amount of free intraperitoneal air most evident over the anterior margin of the liver. No ascites. Musculoskeletal: No fracture or acute finding.  No bone lesion. IMPRESSION: 1. High-grade partial small bowel obstruction due to an apparent peridiverticular abscess in the left lower quadrant/left anterior upper pelvis. The abscess is contiguous with inflammation and bubbles of air that tract to the adjacent proximal to mid sigmoid colon, where there are several small diverticula. This collection is likely chronic, since air is no significant colonic wall thickening to suggest active diverticulitis. The collection measures 5.1 cm in greatest dimension. 2.  Trace amount of free intraperitoneal air. 3. No other acute abnormality within  the abdomen or pelvis. 4. Aortic atherosclerosis. Electronically Signed   By: Lajean Manes M.D.   On: 06/03/2020 16:10   DG Abd Portable 1 View  Result Date: 06/03/2020 CLINICAL DATA:  NG tube placement. EXAM: PORTABLE ABDOMEN - 1 VIEW COMPARISON:  Current abdomen and pelvis CT. FINDINGS: Nasogastric tube projects the left upper quadrant within the proximal stomach. The side hole of the tube is superior to the included field of view. Dilated loops of small bowel are noted consistent with the high-grade obstruction noted on the current CT. IMPRESSION: Nasogastric tube tip projects in the proximal stomach. Electronically Signed   By: Lajean Manes M.D.   On: 06/03/2020 19:45   CT IMAGE GUIDED DRAINAGE BY PERCUTANEOUS CATHETER  Result Date: 06/04/2020 INDICATION: Left lower quadrant diverticular abscess EXAM: CT-GUIDED LEFT LOWER QUADRANT ABSCESS DRAIN MEDICATIONS: The patient is currently admitted to the hospital and receiving intravenous antibiotics. The antibiotics were administered within an appropriate time frame prior to the initiation of the procedure. ANESTHESIA/SEDATION: Fentanyl 100 mcg IV; Versed 1 mg IV Moderate Sedation Time:  10 MINUTES The patient was continuously monitored during the procedure by the interventional radiology nurse under my direct supervision. COMPLICATIONS: None immediate. PROCEDURE: Informed written consent was obtained from the patient after a thorough discussion of the procedural risks, benefits and alternatives. All questions were addressed. Maximal Sterile Barrier Technique was utilized including caps, mask, sterile gowns, sterile gloves, sterile drape, hand hygiene and skin antiseptic. A timeout was performed prior to the initiation of the procedure. Previous imaging reviewed. Patient positioned supine. Noncontrast localization CT performed. The left lower quadrant abscess was localized and  marked for an anterior approach. Under sterile conditions and local anesthesia, an 18 gauge needle was introduced from an anterior approach into the abscess. Needle position confirmed with CT. Syringe aspiration yielded purulent fluid. Guidewire advanced and confirmed in the abscess with CT. Tract dilatation performed to insert a 10 [place] drain. Drain catheter position confirmed with CT as well. Syringe aspiration yielded 15 cc purulent fluid. Sample sent for culture. Catheter secured with a Prolene suture and connected to external suction bulb. Sterile dressing applied. No immediate complication. Patient tolerated the procedure well. IMPRESSION: Successful CT-guided left lower quadrant abscess drain placement. Electronically Signed   By: Jerilynn Mages.  Shick M.D.   On: 06/04/2020 15:12    Anti-infectives: Anti-infectives (From admission, onward)   Start     Dose/Rate Route Frequency Ordered Stop   06/03/20 2330  piperacillin-tazobactam (ZOSYN) IVPB 3.375 g        3.375 g 12.5 mL/hr over 240 Minutes Intravenous Every 8 hours 06/03/20 1837     06/03/20 1700  piperacillin-tazobactam (ZOSYN) IVPB 3.375 g        3.375 g 100 mL/hr over 30 Minutes Intravenous  Once 06/03/20 1652 06/03/20 1836      Assessment/Plan: Problem List: Patient Active Problem List   Diagnosis Date Noted  . Diverticulitis of colon with perforation 06/03/2020    Remove NG and start clear liquids * No surgery found *    LOS: 2 days   Matt B. Hassell Done, MD, [facility] [phone] to reach the surgeon on call.    06/05/2020 9:22 AM m

## 2020-06-06 ENCOUNTER — Other Ambulatory Visit: Payer: Self-pay | Admitting: General Surgery

## 2020-06-06 LAB — BASIC METABOLIC PANEL
Anion gap: 13 (ref 5–15)
BUN: 14 mg/dL (ref 6–20)
CO2: 23 mmol/L (ref 22–32)
Calcium: 8.4 mg/dL — ABNORMAL LOW (ref 8.9–10.3)
Chloride: 103 mmol/L (ref 98–111)
Creatinine, Ser: 1.01 mg/dL (ref 0.61–1.24)
GFR, Estimated: >60 mL/min (ref >60)
Glucose, Bld: 49 mg/dL — ABNORMAL LOW (ref 70–99)
Potassium: 3.5 mmol/L (ref 3.5–5.1)
Sodium: 139 mmol/L (ref 135–145)

## 2020-06-06 LAB — CBC
HCT: 40.9 % (ref 39.0–52.0)
Hemoglobin: 13.7 g/dL (ref 13.0–17.0)
MCH: 30.7 pg (ref 26.0–34.0)
MCHC: 33.5 g/dL (ref 30.0–36.0)
MCV: 91.7 fL (ref 80.0–100.0)
Platelets: 409 10*3/uL — ABNORMAL HIGH (ref 150–400)
RBC: 4.46 MIL/uL (ref 4.22–5.81)
RDW: 14.3 % (ref 11.5–15.5)
WBC: 9.5 10*3/uL (ref 4.0–10.5)
nRBC: 0 % (ref 0.0–0.2)

## 2020-06-06 LAB — GLUCOSE, CAPILLARY: Glucose-Capillary: 107 mg/dL — ABNORMAL HIGH (ref 70–99)

## 2020-06-06 MED ORDER — AMOXICILLIN-POT CLAVULANATE 875-125 MG PO TABS: 1.0000 | ORAL_TABLET | Freq: Two times a day (BID) | ORAL | 0 refills | Status: DC

## 2020-06-06 MED ORDER — AMOXICILLIN-POT CLAVULANATE 875-125 MG PO TABS: 1.0000 | ORAL_TABLET | Freq: Two times a day (BID) | ORAL | 0 refills | Status: AC

## 2020-06-06 MED ORDER — MENTHOL 3 MG MT LOZG
1.0000 | LOZENGE | OROMUCOSAL | Status: DC | PRN
  Filled 2020-06-06: qty 9

## 2020-06-06 MED ORDER — ACETAMINOPHEN 325 MG PO TABS
650.0000 mg | ORAL_TABLET | Freq: Four times a day (QID) | ORAL | Status: AC | PRN
Start: 2020-06-06 — End: ?

## 2020-06-06 MED ORDER — AMOXICILLIN-POT CLAVULANATE 875-125 MG PO TABS
1.0000 | ORAL_TABLET | Freq: Once | ORAL | Status: AC
  Administered 2020-06-06: 1 via ORAL
  Filled 2020-06-06: qty 1

## 2020-06-06 MED FILL — AMOX-CLAV 875-125 MG TABLET: 875-125 | 7 days supply | Qty: 14 | Fill #0

## 2020-06-06 NOTE — Progress Notes (Signed)
[employer] to be D/C'd  per MD order. Discussed with the patient and all questions fully answered.  VSS, Skin clean, dry and intact without evidence of skin break down, no evidence of skin tears noted.  IV catheter discontinued intact. Site without signs and symptoms of complications. Dressing and pressure applied.  An After Visit Summary was printed and given to the patient. Patient received antibiotics from [facility].  D/c education completed with patient/family including follow up instructions, medication list, d/c activities limitations if indicated, with other d/c instructions as indicated by MD - patient able to verbalize understanding, all questions fully answered.   Patient instructed to return to ED, call 911, or call MD for any changes in condition.   Patient ambulated off unit with friend, and D/C home via private auto.  Family friend demonstrated proper drain care.

## 2020-06-06 NOTE — Discharge Summary (Signed)
    Patient ID: William [id number] 1960-08-04 60 y.o.  Admit date: 06/03/2020 Discharge date: 06/06/2020  Admitting Diagnosis: Diverticulitis with abscess  Discharge Diagnosis Patient Active Problem List   Diagnosis Date Noted  . Diverticulitis of colon with perforation 06/03/2020    Consultants IR  Reason for Admission: 18 yom who is otherwise healthy presents with one week of ab pain in llq, n/v.  This started last Friday and he thought he had a pulled msucle.  He has no fevers. Nothing at home making it better. Never had csc. He smokes daily. He went to urgent care yesterday and diagnosed with uti but pain worsened and he came to er today.  He has not taken his temp but has had subjective fever.  He had a ct scan and appears to have perforated diverticulitis with resultant partial sbo. He has had a bm yesterday and passed some flatus today.    Procedures Percutaneous drain placement by IR on 06/04/20  Hospital Course:  The patient was admitted and started on IV Zosyn.  IR was consulted and they placed a perc drain for his abscess the day after admission.  He rapidly improved and was tolerating a soft diet, moving his bowels, with resolved pain by HD 3.  His drain stable with slightly cloudy serosanguineous output.  He was stable for DC home with follow up with surgery as well as IR drain clinic.  Allergies as of 06/06/2020   No Known Allergies     Medication List    STOP taking these medications   ciprofloxacin 500 MG tablet Commonly known as: CIPRO     TAKE these medications   acetaminophen 325 MG tablet Commonly known as: TYLENOL Take 2 tablets (650 mg total) by mouth every 6 (six) hours as needed for mild pain (or temp > 100).   Airborne+Probiotic Chew Chew 3 tablets by mouth daily.   amoxicillin-clavulanate 875-125 MG tablet Commonly known as: Augmentin Take 1 tablet by mouth 2 (two) times daily for 7 days.   ibuprofen 200 MG tablet Commonly known  as: ADVIL Take 400 mg by mouth every 6 (six) hours as needed for headache (pain).   multivitamin with minerals Tabs tablet Take 1 tablet by mouth daily.   ondansetron 4 MG disintegrating tablet Commonly known as: ZOFRAN-ODT Take 4 mg by mouth every 8 (eight) hours as needed for nausea or vomiting.         Follow-up Information    Rolm Bookbinder, MD. Go on 06/21/2020.   Specialty: General Surgery Why: at 2:30PM for follow up of recent diverticululitis and drain placement. please arrive by 2:00 PM to get checked in and fill out any necessary paperwork. Contact information: [place] [address] [phone]        Greggory Keen, MD Follow up in 1 week(s).   Specialties: Interventional Radiology, Radiology Why: They should call you for this appointment date and time Contact information: [place] [address] [phone]               Signed: Saverio Danker, [facility] 06/06/2020, 1:31 PM Please see Amion for pager number during day hours 7:00am-4:30pm, 7-11:30am on Weekends

## 2020-06-06 NOTE — Progress Notes (Signed)
[facility] Progress Note     Subjective: CC:  Denies pain ,only has mild soreness around drain site. Tolerating CLD. Mobilizing in room. Denies urinary sxs - states urine is clear/yellow. +flatus and Bm. Asking to go home - states his girlfriend is an Therapist, sports and can help with drain.  Objective: Vital signs in last 24 hours: Temp:  [98.2 F (36.8 C)-98.5 F (36.9 C)] 98.3 F (36.8 C) (02/14 0435) Pulse Rate:  [57-60] 58 (02/14 0435) Resp:  [16-17] 16 (02/14 0435) BP: (99-107)/(67-83) 102/70 (02/14 0435) SpO2:  [87 %-97 %] 97 % (02/14 0435) Last BM Date: 06/02/20  Intake/Output from previous day: 02/13 0701 - 02/14 0700 In: [place] [P.O.:560; I.V.:960] Out: 395 [Urine:375; Drains:20] Intake/Output this shift: No intake/output data recorded.  PE: Gen:  Alert, NAD, pleasant Card:  Regular rate and rhythm, pedal pulses 2+ BL Pulm:  Normal effort, clear to auscultation bilaterally Abd: Soft, non-tender, mild distention, bowel sounds present in all 4 quadrants, LLQ JP drain with cloudy SS drainage (20 cc/24h) Skin: warm and dry, no rashes  Psych: A&Ox3   Lab Results:  Recent Labs    06/04/20 0104 06/06/20 0224  WBC 9.1 9.5  HGB 15.9 13.7  HCT 46.6 40.9  PLT 425* 409*   BMET Recent Labs    06/03/20 1236 06/04/20 0104  NA 135 135  K 4.2 3.9  CL 92* 94*  CO2 25 23  GLUCOSE 104* 98  BUN 39* 47*  CREATININE 1.95* 2.15*  CALCIUM 9.5 8.9   PT/INR No results for input(s): LABPROT, INR in the last 72 hours. CMP     Component Value Date/Time   NA 135 06/04/2020 0104   K 3.9 06/04/2020 0104   CL 94 (L) 06/04/2020 0104   CO2 23 06/04/2020 0104   GLUCOSE 98 06/04/2020 0104   BUN 47 (H) 06/04/2020 0104   CREATININE 2.15 (H) 06/04/2020 0104   CALCIUM 8.9 06/04/2020 0104   PROT 8.5 (H) 06/03/2020 1236   ALBUMIN 3.9 06/03/2020 1236   AST 29 06/03/2020 1236   ALT 29 06/03/2020 1236   ALKPHOS 46 06/03/2020 1236   BILITOT 1.0 06/03/2020 1236   GFRNONAA 35 (L)  06/04/2020 0104   Lipase     Component Value Date/Time   LIPASE 26 06/03/2020 1236       Studies/Results: CT IMAGE GUIDED DRAINAGE BY PERCUTANEOUS CATHETER  Result Date: 06/04/2020 INDICATION: Left lower quadrant diverticular abscess EXAM: CT-GUIDED LEFT LOWER QUADRANT ABSCESS DRAIN MEDICATIONS: The patient is currently admitted to the hospital and receiving intravenous antibiotics. The antibiotics were administered within an appropriate time frame prior to the initiation of the procedure. ANESTHESIA/SEDATION: Fentanyl 100 mcg IV; Versed 1 mg IV Moderate Sedation Time:  10 MINUTES The patient was continuously monitored during the procedure by the interventional radiology nurse under my direct supervision. COMPLICATIONS: None immediate. PROCEDURE: Informed written consent was obtained from the patient after a thorough discussion of the procedural risks, benefits and alternatives. All questions were addressed. Maximal Sterile Barrier Technique was utilized including caps, mask, sterile gowns, sterile gloves, sterile drape, hand hygiene and skin antiseptic. A timeout was performed prior to the initiation of the procedure. Previous imaging reviewed. Patient positioned supine. Noncontrast localization CT performed. The left lower quadrant abscess was localized and marked for an anterior approach. Under sterile conditions and local anesthesia, an 18 gauge needle was introduced from an anterior approach into the abscess. Needle position confirmed with CT. Syringe aspiration yielded purulent fluid. Guidewire advanced and  confirmed in the abscess with CT. Tract dilatation performed to insert a 10 [place] drain. Drain catheter position confirmed with CT as well. Syringe aspiration yielded 15 cc purulent fluid. Sample sent for culture. Catheter secured with a Prolene suture and connected to external suction bulb. Sterile dressing applied. No immediate complication. Patient tolerated the procedure well. IMPRESSION:  Successful CT-guided left lower quadrant abscess drain placement. Electronically Signed   By: Jerilynn Mages.  Shick M.D.   On: 06/04/2020 15:12    Anti-infectives: Anti-infectives (From admission, onward)   Start     Dose/Rate Route Frequency Ordered Stop   06/03/20 2330  piperacillin-tazobactam (ZOSYN) IVPB 3.375 g        3.375 g 12.5 mL/hr over 240 Minutes Intravenous Every 8 hours 06/03/20 1837     06/03/20 1700  piperacillin-tazobactam (ZOSYN) IVPB 3.375 g        3.375 g 100 mL/hr over 30 Minutes Intravenous  Once 06/03/20 1652 06/03/20 1836     Assessment/Plan AKI - resolved with IV hydration (1.01 today from 2.15)  Likely perforated diverticulitis with abscess - obstructive sxs resolved, tolerating PO - s/p IR perc drain of abscess 2/12, GS with GNR/GPC, cx was re-incubated - WBC 9.5, normalized s/p perc drain and IV abx  - clinically the patient is improving. He denies pain and is having bowel function. I will advance him to SOFT diet. Plan to D/C home on PO abx this afternoon, after discussing with my attending. Will need OP follow up with IR and CCS.    LOS: 3 days    Obie Dredge, [facility] Please see Amion for pager number during day hours 7:00am-4:30pm

## 2020-06-06 NOTE — Discharge Instructions (Signed)
Diverticulitis  Diverticulitis is infection or inflammation of small pouches (diverticula) in the colon that form due to a condition called diverticulosis. Diverticula can trap stool (feces) and bacteria, causing infection and inflammation. Diverticulitis may cause severe stomach pain and diarrhea. It may lead to tissue damage in the colon that causes bleeding or blockage. The diverticula may also burst (rupture) and cause infected stool to enter other areas of the abdomen. What are the causes? This condition is caused by stool becoming trapped in the diverticula, which allows bacteria to grow in the diverticula. This leads to inflammation and infection. What increases the risk? You are more likely to develop this condition if you have diverticulosis. The risk increases if you:  Are overweight or obese.  Do not get enough exercise.  Drink alcohol.  Use tobacco products.  Eat a diet that has a lot of red meat such as beef, pork, or lamb.  Eat a diet that does not include enough fiber. High-fiber foods include fruits, vegetables, beans, nuts, and whole grains.  Are over 40 years of age. What are the signs or symptoms? Symptoms of this condition may include:  Pain and tenderness in the abdomen. The pain is normally located on the left side of the abdomen, but it may occur in other areas.  Fever and chills.  Nausea.  Vomiting.  Cramping.  Bloating.  Changes in bowel routines.  Blood in your stool. How is this diagnosed? This condition is diagnosed based on:  Your medical history.  A physical exam.  Tests to make sure there is nothing else causing your condition. These tests may include: ? Blood tests. ? Urine tests. ? CT scan of the abdomen. How is this treated? Most cases of this condition are mild and can be treated at home. Treatment may include:  Taking over-the-counter pain medicines.  Following a clear liquid diet.  Taking antibiotic medicines by  mouth.  Resting. More severe cases may need to be treated at a hospital. Treatment may include:  Not eating or drinking.  Taking prescription pain medicine.  Receiving antibiotic medicines through an IV.  Receiving fluids and nutrition through an IV.  Surgery. When your condition is under control, your health care provider may recommend that you have a colonoscopy. This is an exam to look at the entire large intestine. During the exam, a lubricated, bendable tube is inserted into the anus and then passed into the rectum, colon, and other parts of the large intestine. A colonoscopy can show how severe your diverticula are and whether something else may be causing your symptoms. Follow these instructions at home: Medicines  Take over-the-counter and prescription medicines only as told by your health care provider. These include fiber supplements, probiotics, and stool softeners.  If you were prescribed an antibiotic medicine, take it as told by your health care provider. Do not stop taking the antibiotic even if you start to feel better.  Ask your health care provider if the medicine prescribed to you requires you to avoid driving or using machinery. Eating and drinking  Follow a full liquid diet or another diet as directed by your health care provider.  After your symptoms improve, your health care provider may tell you to change your diet. He or she may recommend that you eat a diet that contains at least 25 grams (25 g) of fiber daily. Fiber makes it easier to pass stool. Healthy sources of fiber include: ? Berries. One cup contains 4-8 grams of fiber. ? Beans   or lentils. One-half cup contains 5-8 grams of fiber. ? Green vegetables. One cup contains 4 grams of fiber.  Avoid eating red meat.   General instructions  Do not use any products that contain nicotine or tobacco, such as cigarettes, e-cigarettes, and chewing tobacco. If you need help quitting, ask your health care  provider.  Exercise for at least 30 minutes, 3 times each week. You should exercise hard enough to raise your heart rate and break a sweat.  Keep all follow-up visits as told by your health care provider. This is important. You may need to have a colonoscopy. Contact a health care provider if:  Your pain does not improve.  Your bowel movements do not return to normal. Get help right away if:  Your pain gets worse.  Your symptoms do not get better with treatment.  Your symptoms suddenly get worse.  You have a fever.  You vomit more than one time.  You have stools that are bloody, black, or tarry. Summary  Diverticulitis is infection or inflammation of small pouches (diverticula) in the colon that form due to a condition called diverticulosis. Diverticula can trap stool (feces) and bacteria, causing infection and inflammation.  You are at higher risk for this condition if you have diverticulosis and you eat a diet that does not include enough fiber.  Most cases of this condition are mild and can be treated at home. More severe cases may need to be treated at a hospital.  When your condition is under control, your health care provider may recommend that you have an exam called a colonoscopy. This exam can show how severe your diverticula are and whether something else may be causing your symptoms.  Keep all follow-up visits as told by your health care provider. This is important. This information is not intended to replace advice given to you by your health care provider. Make sure you discuss any questions you have with your health care provider. Document Revised: 01/19/2019 Document Reviewed: 01/19/2019 Elsevier Patient Education  2021 [employer].   [email or web address].[email or web address]">  Percutaneous Abscess Drain Placement, Care After This sheet gives you information about how to care for yourself after your procedure. Your health care provider may also give you more specific  instructions. If you have problems or questions, contact your health care provider. What can I expect after the procedure? After the procedure, it is common to have:  A small amount of bruising and discomfort in the area where the drainage tube (catheter) was placed.  Sleepiness and fatigue. This should go away after the medicines you were given have worn off. Follow these instructions at home: Incision care  Follow instructions from your health care provider about how to take care of your incision. Make sure you: ? Wash your hands with soap and water for at least 20 seconds before and after you change your bandage (dressing). If soap and water are not available, use hand sanitizer. ? Change your dressing as told by your health care provider. ? Leave stitches (sutures), skin glue, or adhesive strips in place. These skin closures may need to stay in place for 2 weeks or longer. If adhesive strip edges start to loosen and curl up, you may trim the loose edges. Do not remove adhesive strips completely unless your health care provider tells you to do that.  Check your incision area every day for signs of infection. Check for: ? More redness, swelling, or pain. ? More fluid or blood. ? Warmth. ? Pus  or a bad smell.   Catheter care  Follow instructions from your health care provider about emptying and cleaning your catheter and collection bag or drainage bulb. You may need to clean the catheter every day so it does not clog.  If directed, write down the following information every time you empty your bag: ? The date and time. ? The amount of drainage.  Check for fluid leaking from around your catheter (instead of fluid draining through your catheter). This may be a sign that the drain is no longer working correctly. Activity  Rest at home for 1-2 days after your procedure.  Return to your normal activities as told by your health care provider. Ask your health care provider what activities  are safe for you.  If you were given a sedative during the procedure, it can affect you for several hours. Do not drive or operate machinery until your health care provider says that it is safe. General instructions  Take over-the-counter and prescription medicines only as told by your health care provider.  If you were prescribed an antibiotic medicine, take it as told by your health care provider. Do not stop using the antibiotic even if you start to feel better.  Do not take showers, take baths, swim, or use a hot tub for 24 hours after your procedure or until your health care provider says that this is okay.  Do not use any products that contain nicotine or tobacco, such as cigarettes, e-cigarettes, and chewing tobacco. If you need help quitting, ask your health care provider.  Keep all follow-up visits as told by your health care provider. This is important. Contact a health care provider if:  You have less than 10 mL of drainage a day for 2-3 days in a row, or as directed by your health care provider.  You have any of these signs of infection: ? More redness, swelling, or pain around your incision area. ? More fluid or blood coming from your incision area. ? Warmth coming from your incision area. ? Pus or a bad smell coming from your incision area.  You have fluid leaking from around your catheter (instead of through your catheter).  You have a fever or chills.  You have pain that does not get better with medicine. Get help right away if:  Your catheter comes out.  You suddenly stop having drainage from your catheter.  You suddenly have blood in the fluid that is draining from your catheter.  You become dizzy or you faint.  You develop a rash.  You have nausea or vomiting.  You have difficulty breathing or you feel short of breath.  You develop chest pain.  You have problems with your speech or vision.  You have trouble balancing or moving your arms or  legs. Summary  It is common to have a small amount of bruising and discomfort in the area where the drainage tube (catheter) was placed.  Follow instructions from your health care provider about emptying and cleaning your catheter and collection bag or drainage bulb.  You may be directed to record the amount of drainage from the bag every time you empty it.  Contact a health care provider if you have more redness, swelling, or pain around your incision area or if you have pain that does not get better with medicine. This information is not intended to replace advice given to you by your health care provider. Make sure you discuss any questions you have with your  health care provider. Document Revised: 04/04/2019 Document Reviewed: 04/04/2019 Elsevier Patient Education  2021 [employer].

## 2020-06-07 ENCOUNTER — Other Ambulatory Visit: Payer: Self-pay | Admitting: General Surgery

## 2020-06-07 DIAGNOSIS — K630 Abscess of intestine: Secondary | ICD-10-CM

## 2020-06-08 LAB — AEROBIC/ANAEROBIC CULTURE W GRAM STAIN (SURGICAL/DEEP WOUND)

## 2020-06-16 ENCOUNTER — Ambulatory Visit
Admission: RE | Admit: 2020-06-16 | Discharge: 2020-06-16 | Disposition: A | Discharge status: Home / Self Care | Payer: PRIVATE HEALTH INSURANCE | Source: Ambulatory Visit | Attending: General Surgery | Admitting: General Surgery

## 2020-06-16 ENCOUNTER — Ambulatory Visit
Admission: RE | Admit: 2020-06-16 | Discharge: 2020-06-16 | Disposition: A | Discharge status: Home / Self Care | Payer: PRIVATE HEALTH INSURANCE | Source: Ambulatory Visit | Attending: Student | Admitting: Student

## 2020-06-16 DIAGNOSIS — K630 Abscess of intestine: Secondary | ICD-10-CM

## 2020-06-16 HISTORY — PX: IR RADIOLOGIST EVAL & MGMT: IMG5224

## 2020-06-16 MED ORDER — IOPAMIDOL (ISOVUE-300) INJECTION 61%
100.0000 mL | Freq: Once | INTRAVENOUS | Status: AC | PRN
  Administered 2020-06-16: 100 mL via INTRAVENOUS

## 2020-06-16 NOTE — Progress Notes (Signed)
Referring Physician(s): Dr. Donne Hazel  Chief Complaint: The patient is seen in follow up today s/p left lower quadrant diverticular abscess drain placed 06/04/20 by Dr. Annamaria Boots.   History of present illness:  William Boyd, an otherwise healthy 60 year old male, presented to the ED 06/03/20 with generalized abdominal pain with nausea and vomiting. He was found to have a perforated diverticulitis with abscess and partial small bowel obstruction. Surgery was consulted and in the interest of conservative management, IR was requested to place a percutaneous abscess drain. On 06/04/20 a left lower quadrant abscess drain was placed and he was discharged home 06/06/20.  William Boyd presents today to the outpatient clinic for a drain evaluation including CT imaging and drain injection. He has been afebrile and endorses a strong appetite and good energy levels. He has completed his course of oral antibiotics. He has been flushing the drain twice daily and reports a daily output of approximately 5 ml. He denies any pain or discomfort.   No past medical history on file.  Allergies: Patient has no known allergies.  Medications: Prior to Admission medications   Medication Sig Start Date End Date Taking? Authorizing Provider  acetaminophen (TYLENOL) 325 MG tablet Take 2 tablets (650 mg total) by mouth every 6 (six) hours as needed for mild pain (or temp > 100). 06/06/20   Jill Alexanders, PA-C  ibuprofen (ADVIL) 200 MG tablet Take 400 mg by mouth every 6 (six) hours as needed for headache (pain).    [provider]  Multiple Vitamin (MULTIVITAMIN WITH MINERALS) TABS tablet Take 1 tablet by mouth daily.    [provider]  Multiple Vitamins-Minerals (AIRBORNE+PROBIOTIC) CHEW Chew 3 tablets by mouth daily.    [provider]  ondansetron (ZOFRAN-ODT) 4 MG disintegrating tablet Take 4 mg by mouth every 8 (eight) hours as needed for nausea or vomiting.    [provider]      No family history on file.  Social History   Socioeconomic History  . Marital status: Legally Separated    Spouse name: Not on file  . Number of children: Not on file  . Years of education: Not on file  . Highest education level: Not on file  Occupational History  . Not on file  Tobacco Use  . Smoking status: Not on file  . Smokeless tobacco: Not on file  Substance and Sexual Activity  . Alcohol use: Not on file  . Drug use: Not on file  . Sexual activity: Not on file  Other Topics Concern  . Not on file  Social History Narrative  . Not on file   Social Determinants of Health   Financial Resource Strain: Not on file  Food Insecurity: Not on file  Transportation Needs: Not on file  Physical Activity: Not on file  Stress: Not on file  Social Connections: Not on file     Vital Signs: BP 135/86   Pulse 62   Temp 98.3 F (36.8 C)   SpO2 98%   Physical Exam Constitutional:      General: He is not in acute distress. Pulmonary:     Effort: Pulmonary effort is normal.  Abdominal:     Palpations: Abdomen is soft.     Tenderness: There is no abdominal tenderness.     Comments: LLQ drain to suction. Approximately 5 ml of thin, cloudy serosanguineous fluid. Skin insertion site is unremarkable; stat-lock and suture intact.   Musculoskeletal:        General:  Normal range of motion.  Skin:    General: Skin is warm and dry.  Neurological:     Mental Status: He is alert and oriented to person, place, and time.  Psychiatric:        Mood and Affect: Mood normal.        Behavior: Behavior normal.        Thought Content: Thought content normal.        Judgment: Judgment normal.     Imaging: CT ABDOMEN PELVIS W CONTRAST  Result Date: 06/16/2020 CLINICAL DATA:  Diverticular abscess, status post percutaneous drain catheter placement 06/04/2020. Minimal continued output from the drain. EXAM: CT ABDOMEN AND PELVIS WITH CONTRAST TECHNIQUE: Multidetector CT imaging of  the abdomen and pelvis was performed using the standard protocol following bolus administration of intravenous contrast. CONTRAST:  145mL ISOVUE-300 IOPAMIDOL (ISOVUE-300) INJECTION 61% COMPARISON:  06/03/2020 FINDINGS: Lower chest: No acute abnormality. Hepatobiliary: Stable probable cysts in segments 5 and 7. No new liver lesion. No biliary ductal dilatation. Gallbladder unremarkable. Pancreas: Unremarkable. No pancreatic ductal dilatation or surrounding inflammatory changes. Spleen: Normal in size without focal abnormality. Adrenals/Urinary Tract: Normal adrenal glands. Left kidney unremarkable. Probable right renal cysts, stable. No hydronephrosis or urolithiasis. Stomach/Bowel: Stomach is nondistended. Small bowel decompressed. Normal appendix. The colon is nondilated with a few scattered descending diverticula. Left lower quadrant pigtail drain catheter stable in position with resolution of the previously identified gas/fluid collection. No residual undrained component. Vascular/Lymphatic: Aortoiliac scattered calcified atheromatous plaque without aneurysm or evident high-grade stenosis. No abdominal or pelvic adenopathy. Reproductive: Mild prostate enlargement with central coarse calcifications. Other: No ascites.  No free air. Musculoskeletal: No acute or significant osseous findings. IMPRESSION: 1. Resolution of left lower quadrant abscess post percutaneous drain catheter placement. 2. Resolution of small bowel obstruction since prior study. Aortic Atherosclerosis (ICD10-I70.0). Electronically Signed   By: Lucrezia Europe M.D.   On: 06/16/2020 12:59   IR Radiologist Eval & Mgmt  Result Date: 06/16/2020 Please refer to notes tab for details about interventional procedure. (Op Note)   Labs:  CBC: Recent Labs    06/03/20 1236 06/04/20 0104 06/06/20 0224  WBC 11.2* 9.1 9.5  HGB 16.9 15.9 13.7  HCT 49.6 46.6 40.9  PLT 463* 425* 409*    COAGS: No results for input(s): INR, APTT in the last 8760  hours.  BMP: Recent Labs    06/03/20 1236 06/04/20 0104 06/06/20 0224  NA 135 135 139  K 4.2 3.9 3.5  CL 92* 94* 103  CO2 25 23 23   GLUCOSE 104* 98 49*  BUN 39* 47* 14  CALCIUM 9.5 8.9 8.4*  CREATININE 1.95* 2.15* 1.01  GFRNONAA 39* 35* >60    LIVER FUNCTION TESTS: Recent Labs    06/03/20 1236  BILITOT 1.0  AST 29  ALT 29  ALKPHOS 46  PROT 8.5*  ALBUMIN 3.9    Assessment:   Left lower quadrant diverticular abscess S/p drain placed 06/04/20: CT abdomen/pelvis today shows resolution of the left lower quadrant abscess as well as resolution of the small bowel obstruction. Contrast drain injection under fluoroscopy was negative for a fistula. Please see the full reports for each study under Imaging in Epic.   Given these findings and the patient's report of well-being the drain was removed. There were no issues with the drain removal and he was instructed to wait 24 hours before showering and one week before submerging the site. He has a follow up with Dr. Donne Hazel on July 10, 2020 and he was encouraged to keep this appointment. William Boyd knows to call the clinic with any questions or concerns. At the patient's request, he was given a note stating he was ok to return to work.   Signed: Theresa Duty, NP 06/16/2020, 1:09 PM   Please refer to Dr. Adron Bene attestation of this note for management and plan.

## 2020-06-21 ENCOUNTER — Other Ambulatory Visit: Payer: PRIVATE HEALTH INSURANCE

## 2020-09-16 ENCOUNTER — Encounter (HOSPITAL_COMMUNITY): Payer: Self-pay | Admitting: Emergency Medicine

## 2020-09-16 ENCOUNTER — Other Ambulatory Visit: Payer: Self-pay

## 2020-09-16 ENCOUNTER — Inpatient Hospital Stay (HOSPITAL_COMMUNITY)
Admission: EM | Admit: 2020-09-16 | Discharge: 2020-09-20 | DRG: 690 | Disposition: A | Discharge status: Home / Self Care | Payer: PRIVATE HEALTH INSURANCE | Attending: Surgery | Admitting: Surgery

## 2020-09-16 DIAGNOSIS — D72828 Other elevated white blood cell count: Secondary | ICD-10-CM | POA: Diagnosis present

## 2020-09-16 DIAGNOSIS — Z1611 Resistance to penicillins: Secondary | ICD-10-CM | POA: Diagnosis present

## 2020-09-16 DIAGNOSIS — R109 Unspecified abdominal pain: Secondary | ICD-10-CM

## 2020-09-16 DIAGNOSIS — R509 Fever, unspecified: Secondary | ICD-10-CM

## 2020-09-16 DIAGNOSIS — N390 Urinary tract infection, site not specified: Secondary | ICD-10-CM | POA: Diagnosis not present

## 2020-09-16 DIAGNOSIS — N321 Vesicointestinal fistula: Secondary | ICD-10-CM | POA: Diagnosis present

## 2020-09-16 DIAGNOSIS — B9620 Unspecified Escherichia coli [E. coli] as the cause of diseases classified elsewhere: Secondary | ICD-10-CM | POA: Diagnosis present

## 2020-09-16 DIAGNOSIS — F17210 Nicotine dependence, cigarettes, uncomplicated: Secondary | ICD-10-CM | POA: Diagnosis present

## 2020-09-16 DIAGNOSIS — R350 Frequency of micturition: Secondary | ICD-10-CM | POA: Diagnosis present

## 2020-09-16 DIAGNOSIS — Z20822 Contact with and (suspected) exposure to covid-19: Secondary | ICD-10-CM | POA: Diagnosis present

## 2020-09-16 LAB — URINALYSIS, ROUTINE W REFLEX MICROSCOPIC
Bilirubin Urine: NEGATIVE
Glucose, UA: NEGATIVE mg/dL
Ketones, ur: NEGATIVE mg/dL
Nitrite: NEGATIVE
Protein, ur: 100 mg/dL — AB
RBC / HPF: >50 RBC/hpf — ABNORMAL HIGH (ref 0–5)
Specific Gravity, Urine: 1.023 (ref 1.005–1.030)
WBC, UA: >50 WBC/hpf — ABNORMAL HIGH (ref 0–5)
pH: 6 (ref 5.0–8.0)

## 2020-09-16 MED ORDER — ACETAMINOPHEN 325 MG PO TABS
650.0000 mg | ORAL_TABLET | Freq: Once | ORAL | Status: AC
  Administered 2020-09-16: 650 mg via ORAL
  Filled 2020-09-16: qty 2

## 2020-09-16 NOTE — ED Triage Notes (Addendum)
Patient reports dysuria " it burns" /concentrated urine onset this week , denies fever or chills . Febrile at triage.

## 2020-09-16 NOTE — ED Provider Notes (Signed)
Emergency Medicine Provider Triage Evaluation Note  William Boyd , a 60 y.o. male  was evaluated in triage.  Pt complains of dysuria.  The patient endorses a 4-day history of dysuria, urinary frequency, and urinary hesitancy.  Today, he has felt feverish and developed chills.  He took ibuprofen earlier tonight for his symptoms.  He denies back pain, flank pain, penile or testicular pain or swelling, hematuria, diarrhea, or constipation.  Review of Systems  Positive: Dysuria, urinary frequency, urinary hesitancy, fever, chills Negative: Flank pain, back pain, rash, chest pain, shortness of breath, penile or testicular pain or swelling, vomiting, sore throat.  Physical Exam  BP 111/76 (BP Location: Left Arm)   Pulse (!) 111   Temp (!) 101.4 F (38.6 C) (Oral)   Resp 20   Ht 5\' 9"  (1.753 m)   Wt 105 kg   SpO2 97%   BMI 34.18 kg/m  Gen:   Awake, no distress   Resp:  Normal effort  MSK:   Moves extremities without difficulty  Other:  Abdomen is soft, nontender, nondistended.  No rebound or guarding.  No CVA tenderness bilaterally.  Medical Decision Making  Medically screening exam initiated at 11:22 PM.  Appropriate orders placed.  William Boyd was informed that the remainder of the evaluation will be completed by another provider, this initial triage assessment does not replace that evaluation, and the importance of remaining in the ED until their evaluation is complete.  60 year old male presenting with a 4-day history of dysuria, urinary frequency and hesitancy accompanied by fever and chills over the last 24 hours.  Febrile in triage and Tylenol has been administered.  Patient does have a history of perforated diverticulitis earlier this year.  Will order screening labs, urine, and urine culture.  Patient will require further work-up and evaluation in the emergency department.   Joanne Gavel, PA-C 09/16/20 2325    Merryl Hacker, MD 09/17/20 [phone]

## 2020-09-17 ENCOUNTER — Emergency Department (HOSPITAL_COMMUNITY): Payer: PRIVATE HEALTH INSURANCE

## 2020-09-17 ENCOUNTER — Encounter (HOSPITAL_COMMUNITY): Payer: Self-pay

## 2020-09-17 DIAGNOSIS — B9620 Unspecified Escherichia coli [E. coli] as the cause of diseases classified elsewhere: Secondary | ICD-10-CM | POA: Diagnosis present

## 2020-09-17 DIAGNOSIS — R350 Frequency of micturition: Secondary | ICD-10-CM | POA: Diagnosis present

## 2020-09-17 DIAGNOSIS — F17210 Nicotine dependence, cigarettes, uncomplicated: Secondary | ICD-10-CM | POA: Diagnosis present

## 2020-09-17 DIAGNOSIS — D72828 Other elevated white blood cell count: Secondary | ICD-10-CM | POA: Diagnosis present

## 2020-09-17 DIAGNOSIS — N390 Urinary tract infection, site not specified: Secondary | ICD-10-CM | POA: Diagnosis present

## 2020-09-17 DIAGNOSIS — Z20822 Contact with and (suspected) exposure to covid-19: Secondary | ICD-10-CM | POA: Diagnosis present

## 2020-09-17 DIAGNOSIS — Z1611 Resistance to penicillins: Secondary | ICD-10-CM | POA: Diagnosis present

## 2020-09-17 DIAGNOSIS — N321 Vesicointestinal fistula: Secondary | ICD-10-CM | POA: Diagnosis present

## 2020-09-17 LAB — CBC WITH DIFFERENTIAL/PLATELET
Abs Immature Granulocytes: 0.15 10*3/uL — ABNORMAL HIGH (ref 0.00–0.07)
Basophils Absolute: 0.1 10*3/uL (ref 0.0–0.1)
Basophils Relative: 0 %
Eosinophils Absolute: 0.1 10*3/uL (ref 0.0–0.5)
Eosinophils Relative: 0 %
HCT: 44.3 % (ref 39.0–52.0)
Hemoglobin: 15.3 g/dL (ref 13.0–17.0)
Immature Granulocytes: 1 %
Lymphocytes Relative: 7 %
Lymphs Abs: 2 10*3/uL (ref 0.7–4.0)
MCH: 30.8 pg (ref 26.0–34.0)
MCHC: 34.5 g/dL (ref 30.0–36.0)
MCV: 89.1 fL (ref 80.0–100.0)
Monocytes Absolute: 2 10*3/uL — ABNORMAL HIGH (ref 0.1–1.0)
Monocytes Relative: 7 %
Neutro Abs: 25.3 10*3/uL — ABNORMAL HIGH (ref 1.7–7.7)
Neutrophils Relative %: 85 %
Platelets: 359 10*3/uL (ref 150–400)
RBC: 4.97 MIL/uL (ref 4.22–5.81)
RDW: 14.5 % (ref 11.5–15.5)
WBC: 29.9 10*3/uL — ABNORMAL HIGH (ref 4.0–10.5)
nRBC: 0 % (ref 0.0–0.2)

## 2020-09-17 LAB — COMPREHENSIVE METABOLIC PANEL
ALT: 25 U/L (ref 0–44)
AST: 19 U/L (ref 15–41)
Albumin: 3.6 g/dL (ref 3.5–5.0)
Alkaline Phosphatase: 41 U/L (ref 38–126)
Anion gap: 8 (ref 5–15)
BUN: 11 mg/dL (ref 6–20)
CO2: 23 mmol/L (ref 22–32)
Calcium: 8.7 mg/dL — ABNORMAL LOW (ref 8.9–10.3)
Chloride: 104 mmol/L (ref 98–111)
Creatinine, Ser: 0.88 mg/dL (ref 0.61–1.24)
GFR, Estimated: >60 mL/min (ref >60)
Glucose, Bld: 92 mg/dL (ref 70–99)
Potassium: 4 mmol/L (ref 3.5–5.1)
Sodium: 135 mmol/L (ref 135–145)
Total Bilirubin: 1.1 mg/dL (ref 0.3–1.2)
Total Protein: 7.3 g/dL (ref 6.5–8.1)

## 2020-09-17 LAB — HIV ANTIBODY (ROUTINE TESTING W REFLEX): HIV Screen 4th Generation wRfx: NONREACTIVE

## 2020-09-17 LAB — RESP PANEL BY RT-PCR (FLU A&B, COVID) ARPGX2
Influenza A by PCR: NEGATIVE
Influenza B by PCR: NEGATIVE
SARS Coronavirus 2 by RT PCR: NEGATIVE

## 2020-09-17 LAB — LACTIC ACID, PLASMA: Lactic Acid, Venous: 1.1 mmol/L (ref 0.5–1.9)

## 2020-09-17 MED ORDER — MELATONIN 3 MG PO TABS
3.0000 mg | ORAL_TABLET | Freq: Every evening | ORAL | Status: DC | PRN
  Filled 2020-09-17: qty 1

## 2020-09-17 MED ORDER — SODIUM CHLORIDE 0.9 % IV SOLN: INTRAVENOUS | Status: DC

## 2020-09-17 MED ORDER — PIPERACILLIN-TAZOBACTAM 3.375 G IVPB
3.3750 g | Freq: Three times a day (TID) | INTRAVENOUS | Status: DC
  Administered 2020-09-17 – 2020-09-20 (×9): 3.375 g via INTRAVENOUS
  Filled 2020-09-17 (×10): qty 50

## 2020-09-17 MED ORDER — ACETAMINOPHEN 325 MG PO TABS
650.0000 mg | ORAL_TABLET | Freq: Once | ORAL | Status: AC
  Administered 2020-09-17: 650 mg via ORAL
  Filled 2020-09-17: qty 2

## 2020-09-17 MED ORDER — ACETAMINOPHEN 500 MG PO TABS
1000.0000 mg | ORAL_TABLET | Freq: Four times a day (QID) | ORAL | Status: DC | PRN
  Administered 2020-09-17 – 2020-09-18 (×4): 1000 mg via ORAL
  Filled 2020-09-17 (×4): qty 2

## 2020-09-17 MED ORDER — DIPHENHYDRAMINE HCL 50 MG/ML IJ SOLN: 25.0000 mg | Freq: Four times a day (QID) | INTRAMUSCULAR | Status: DC | PRN

## 2020-09-17 MED ORDER — METOPROLOL TARTRATE 5 MG/5ML IV SOLN: 5.0000 mg | Freq: Four times a day (QID) | INTRAVENOUS | Status: DC | PRN

## 2020-09-17 MED ORDER — SODIUM CHLORIDE 0.9 % IV BOLUS
1000.0000 mL | Freq: Once | INTRAVENOUS | Status: AC
  Administered 2020-09-17: 1000 mL via INTRAVENOUS

## 2020-09-17 MED ORDER — SODIUM CHLORIDE 0.9 % IV SOLN
1.0000 g | Freq: Once | INTRAVENOUS | Status: AC
  Administered 2020-09-17: 1 g via INTRAVENOUS
  Filled 2020-09-17: qty 10

## 2020-09-17 MED ORDER — ONDANSETRON HCL 4 MG/2ML IJ SOLN: 4.0000 mg | Freq: Four times a day (QID) | INTRAMUSCULAR | Status: DC | PRN

## 2020-09-17 MED ORDER — ENOXAPARIN SODIUM 40 MG/0.4ML IJ SOSY
40.0000 mg | PREFILLED_SYRINGE | Freq: Every day | INTRAMUSCULAR | Status: DC
  Administered 2020-09-18 – 2020-09-20 (×3): 40 mg via SUBCUTANEOUS
  Filled 2020-09-17 (×3): qty 0.4

## 2020-09-17 MED ORDER — NICOTINE 14 MG/24HR TD PT24
14.0000 mg | MEDICATED_PATCH | Freq: Every day | TRANSDERMAL | Status: DC | PRN
  Administered 2020-09-17: 14 mg via TRANSDERMAL
  Filled 2020-09-17: qty 1

## 2020-09-17 MED ORDER — SIMETHICONE 80 MG PO CHEW
40.0000 mg | CHEWABLE_TABLET | Freq: Four times a day (QID) | ORAL | Status: DC | PRN
  Filled 2020-09-17 (×2): qty 1

## 2020-09-17 MED ORDER — DIPHENHYDRAMINE HCL 25 MG PO CAPS
25.0000 mg | ORAL_CAPSULE | Freq: Four times a day (QID) | ORAL | Status: DC | PRN
Start: 2020-09-17 — End: 2020-09-20

## 2020-09-17 MED ORDER — ONDANSETRON 4 MG PO TBDP: 4.0000 mg | ORAL_TABLET | Freq: Four times a day (QID) | ORAL | Status: DC | PRN

## 2020-09-17 NOTE — ED Notes (Signed)
The pt has had urinary frequency for 3-4 days with a temp  No bloody urine

## 2020-09-17 NOTE — Progress Notes (Signed)
Received pt from ED, GCS 15 appears in no apparent distress, currently denies discomfort n/v/d/chest pain/sob. Pt oriented to room, call light, meal times and bathroom, pt deferred hospital gown. Md orders reviewed with pt.

## 2020-09-17 NOTE — ED Notes (Signed)
Verified that lab received second lactic sent to lab. Was told that it had been received and is "on the instrument'.

## 2020-09-17 NOTE — ED Notes (Signed)
Attempted to call report

## 2020-09-17 NOTE — ED Notes (Signed)
Lab reports that no lactatc acid was received I sent it myself in lab tube

## 2020-09-17 NOTE — ED Provider Notes (Signed)
[facility] EMERGENCY DEPARTMENT Provider Note  CSN: [id number] Arrival date & time: 09/16/20 2238  Chief Complaint(s) Dysuria  HPI William Boyd is a 60 y.o. male here for 4 days of dysuria and frequency. Gradually worsened since onset. Reports that he had air come out while voiding today. 1 day of fevers and chills. Patient denies any back pain, flank pain, testicular pain or swelling, hematuria, penile discharge, diarrhea or constipation. He denies any nausea or vomiting. Reports that he is monogamous with a male partner. Denies anal intercourse.  Patient was admitted in February of this year for bowel obstruction related to an intra-abdominal abscess from likely perforated diverticulitis.  This was treated with antibiotics and percutaneous drainage.  He reports that he has been doing well since being discharged.  HPI  Past Medical History History reviewed. No pertinent past medical history. Patient Active Problem List   Diagnosis Date Noted  . Diverticulitis of colon with perforation 06/03/2020   Home Medication(s) Prior to Admission medications   Medication Sig Start Date End Date Taking? Authorizing Provider  acetaminophen (TYLENOL) 325 MG tablet Take 2 tablets (650 mg total) by mouth every 6 (six) hours as needed for mild pain (or temp > 100). 06/06/20   Jill Alexanders, PA-C  amoxicillin-clavulanate (AUGMENTIN) 875-125 MG tablet TAKE 1 TABLET BY MOUTH TWO TIMES DAILY FOR 7 DAYS. 06/06/20 06/06/21  Saverio Danker, PA-C  ibuprofen (ADVIL) 200 MG tablet Take 400 mg by mouth every 6 (six) hours as needed for headache (pain).    [provider]  Multiple Vitamin (MULTIVITAMIN WITH MINERALS) TABS tablet Take 1 tablet by mouth daily.    [provider]  Multiple Vitamins-Minerals (AIRBORNE+PROBIOTIC) CHEW Chew 3 tablets by mouth daily.    [provider]  ondansetron (ZOFRAN-ODT) 4 MG disintegrating tablet Take 4 mg by  mouth every 8 (eight) hours as needed for nausea or vomiting.    [provider]                                                                                                                                    Past Surgical History Past Surgical History:  Procedure Laterality Date  . HAND SURGERY    . IR RADIOLOGIST EVAL & MGMT  06/16/2020   Family History No family history on file.  Social History Social History   Tobacco Use  . Smoking status: Never Smoker  . Smokeless tobacco: Never Used  Substance Use Topics  . Alcohol use: Never  . Drug use: Never   Allergies Patient has no known allergies.  Review of Systems Review of Systems All other systems are reviewed and are negative for acute change except as noted in the HPI  Physical Exam Vital Signs  I have reviewed the triage vital signs BP (!) 141/72   Pulse 95   Temp 99.6 F (37.6 C)   Resp 18   Ht  5\' 9"  (1.753 m)   Wt 105 kg   SpO2 98%   BMI 34.18 kg/m    Physical Exam Vitals reviewed.  Constitutional:      General: He is not in acute distress.    Appearance: He is well-developed. He is not diaphoretic.  HENT:     Head: Normocephalic and atraumatic.     Jaw: No trismus.     Right Ear: External ear normal.     Left Ear: External ear normal.     Nose: Nose normal.  Eyes:     General: No scleral icterus.    Conjunctiva/sclera: Conjunctivae normal.  Neck:     Trachea: Phonation normal.  Cardiovascular:     Rate and Rhythm: Normal rate and regular rhythm.  Pulmonary:     Effort: Pulmonary effort is normal. No respiratory distress.     Breath sounds: No stridor.  Abdominal:     General: There is no distension.     Tenderness: There is no abdominal tenderness. There is no guarding or rebound.  Musculoskeletal:        General: Normal range of motion.     Cervical back: Normal range of motion.  Neurological:     Mental Status: He is alert and oriented to person, place, and time.   Psychiatric:        Behavior: Behavior normal.     ED Results and Treatments Labs (all labs ordered are listed, but only abnormal results are displayed) Labs Reviewed  CBC WITH DIFFERENTIAL/PLATELET - Abnormal; Notable for the following components:      Result Value   WBC 29.9 (*)    Neutro Abs 25.3 (*)    Monocytes Absolute 2.0 (*)    Abs Immature Granulocytes 0.15 (*)    All other components within normal limits  URINALYSIS, ROUTINE W REFLEX MICROSCOPIC - Abnormal; Notable for the following components:   APPearance CLOUDY (*)    Hgb urine dipstick MODERATE (*)    Protein, ur 100 (*)    Leukocytes,Ua LARGE (*)    RBC / HPF >50 (*)    WBC, UA >50 (*)    Bacteria, UA RARE (*)    All other components within normal limits  COMPREHENSIVE METABOLIC PANEL - Abnormal; Notable for the following components:   Calcium 8.7 (*)    All other components within normal limits  URINE CULTURE  LACTIC ACID, PLASMA                                                                                                                         EKG  EKG Interpretation  Date/Time:    Ventricular Rate:    PR Interval:    QRS Duration:   QT Interval:    QTC Calculation:   R Axis:     Text Interpretation:        Radiology No results found.  Pertinent labs & imaging results that were available during my care of the patient were  reviewed by me and considered in my medical decision making (see chart for details).  Medications Ordered in ED Medications  acetaminophen (TYLENOL) tablet 650 mg (has no administration in time range)  acetaminophen (TYLENOL) tablet 650 mg (650 mg Oral Given 09/16/20 2328)  cefTRIAXone (ROCEPHIN) 1 g in sodium chloride 0.9 % 100 mL IVPB (0 g Intravenous Stopped 09/17/20 0535)  sodium chloride 0.9 % bolus 1,000 mL (0 mLs Intravenous Stopped 09/17/20 2536)                                                                                                                                     Procedures Procedures  (including critical care time)  Medical Decision Making / ED Course I have reviewed the nursing notes for this encounter and the patient's prior records (if available in EHR or on provided paperwork).   Randell Samarth Ogle was evaluated in Emergency Department on 09/17/2020 for the symptoms described in the history of present illness. He was evaluated in the context of the global COVID-19 pandemic, which necessitated consideration that the patient might be at risk for infection with the SARS-CoV-2 virus that causes COVID-19. Institutional protocols and algorithms that pertain to the evaluation of patients at risk for COVID-19 are in a state of rapid change based on information released by regulatory bodies including the CDC and federal and state organizations. These policies and algorithms were followed during the patient's care in the ED.  4 days of dysuria. Noted to be febrile and tachycardic during initial triage process. Seen during the initial MSE process and had screening labs and urine obtained. Labs notable for significant leukocytosis. UA is consistent with a urinary tract infection. Culture sent.  Lactic acid added. We will obtain a CT scan of the abdomen to rule out recurrent intra-abdominal infection vs enterovesicular fistula.  Patient care turned over to Dr Sherry Ruffing. Patient case and results discussed in detail; please see their note for further ED managment.          Final Clinical Impression(s) / ED Diagnoses Final diagnoses:  Lower urinary tract infectious disease      This chart was dictated using voice recognition software.  Despite best efforts to proofread,  errors can occur which can change the documentation meaning.   Fatima Blank, MD 09/17/20 [phone]

## 2020-09-17 NOTE — H&P (Addendum)
William Boyd [birth year]  [id number].    Chief Complaint/Reason for Consult: colovesical fistula with UTI  HPI:  This is a 61 yo black male who was on the [facility] service in February 2022 with diverticulitis and abscess that required a percutaneous drain to be placed.  He tolerated this well and rapidly improved.  He followed up with IR for drain injection which was negative and had his drain removed in late February.  He was set up to see Dr. Donne Hazel in the office on 06/21/20, but he was feeling great and did not show up.  He has continued to feel well since that time with no abdominal pain or issues.  He has been eating well and moving his bowels normally.  He has never had a colonoscopy.  Several days ago, he began having some burning and dysuria.  This has persisted and yesterday he had a significant amount of pneumaturia which frightened him and therefore he presented to the [facility] for evaluation.  He had started running fevers of 101 yesterday at night.  Nothing helped his urinary symptoms, despite drinking cranberry juice at home.  He denies any pain whatsoever in his abdomen.  He has been moving his bowels with no blood present.  Here in the ED he has been found to have a fever of 101.7 at max, initial tachycardia, but this is improving, as well as a WBC of 29K.  He has a + UA for a UTI.  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  We have been asked to evaluate the patient for further recommendations.  ROS: ROS: Please see HPI, otherwise all other systems have been reviewed and are negative.  History reviewed. No pertinent family history.  Past Medical History:  Diagnosis Date  . Diverticulitis of large intestine with abscess 05/2020    Past Surgical History:  Procedure Laterality Date  . HAND SURGERY    . IR RADIOLOGIST EVAL & MGMT  06/16/2020    Social History:  reports that he has been smoking. He has been smoking about 1.00  pack per day. He has never used smokeless tobacco. He reports previous alcohol use. He reports that he does not use drugs.  Allergies: No Known Allergies  (Not in a hospital admission)    Physical Exam: Blood pressure 115/71, pulse (!) 101, temperature 99.1 F (37.3 C), temperature source Oral, resp. rate 16, height 5\' 9"  (1.753 m), weight 105 kg, SpO2 99 %. General: pleasant, WD, WN black male who is laying in bed in NAD HEENT: head is normocephalic, atraumatic.  Sclera are noninjected.  PERRL.  Ears and nose without any masses or lesions.  Mouth is pink and moist Heart: regular, rate, and rhythm.  Normal s1,s2. No obvious murmurs, gallops, or rubs noted.  Palpable radial and pedal pulses bilaterally Lungs: CTAB, no wheezes, rhonchi, or rales noted.  Respiratory effort nonlabored Abd: soft, NT, ND, +BS, no masses, hernias, or organomegaly MS: all 4 extremities are symmetrical with no cyanosis, clubbing, or edema. Skin: warm and dry with no masses, lesions, or rashes Neuro: Cranial nerves 2-12 grossly intact, sensation is normal throughout Psych: A&Ox3 with an appropriate affect.   Results for orders placed or performed during the hospital encounter of 09/16/20 (from the past 48 hour(s))  CBC with Differential     Status: Abnormal   Collection Time: 09/16/20 11:23 PM  Result Value Ref Range   WBC 29.9 (H) 4.0 -  10.5 K/uL   RBC 4.97 4.22 - 5.81 MIL/uL   Hemoglobin 15.3 13.0 - 17.0 g/dL   HCT 44.3 39.0 - 52.0 %   MCV 89.1 80.0 - 100.0 fL   MCH 30.8 26.0 - 34.0 pg   MCHC 34.5 30.0 - 36.0 g/dL   RDW 14.5 11.5 - 15.5 %   Platelets 359 150 - 400 K/uL   nRBC 0.0 0.0 - 0.2 %   Neutrophils Relative % 85 %   Neutro Abs 25.3 (H) 1.7 - 7.7 K/uL   Lymphocytes Relative 7 %   Lymphs Abs 2.0 0.7 - 4.0 K/uL   Monocytes Relative 7 %   Monocytes Absolute 2.0 (H) 0.1 - 1.0 K/uL   Eosinophils Relative 0 %   Eosinophils Absolute 0.1 0.0 - 0.5 K/uL   Basophils Relative 0 %   Basophils Absolute  0.1 0.0 - 0.1 K/uL   Immature Granulocytes 1 %   Abs Immature Granulocytes 0.15 (H) 0.00 - 0.07 K/uL    Comment: Performed at [facility] [address], [place], [place] [address]  Urinalysis, Routine w reflex microscopic Urine, Catheterized     Status: Abnormal   Collection Time: 09/16/20 11:23 PM  Result Value Ref Range   Color, Urine YELLOW YELLOW   APPearance CLOUDY (A) CLEAR   Specific Gravity, Urine 1.023 1.005 - 1.030   pH 6.0 5.0 - 8.0   Glucose, UA NEGATIVE NEGATIVE mg/dL   Hgb urine dipstick MODERATE (A) NEGATIVE   Bilirubin Urine NEGATIVE NEGATIVE   Ketones, ur NEGATIVE NEGATIVE mg/dL   Protein, ur 100 (A) NEGATIVE mg/dL   Nitrite NEGATIVE NEGATIVE   Leukocytes,Ua LARGE (A) NEGATIVE   RBC / HPF >50 (H) 0 - 5 RBC/hpf   WBC, UA >50 (H) 0 - 5 WBC/hpf   Bacteria, UA RARE (A) NONE SEEN   Mucus PRESENT     Comment: Performed at [facility] [address] [place], [place] [address]  Comprehensive metabolic panel     Status: Abnormal   Collection Time: 09/16/20 11:23 PM  Result Value Ref Range   Sodium 135 135 - 145 mmol/L   Potassium 4.0 3.5 - 5.1 mmol/L   Chloride 104 98 - 111 mmol/L   CO2 23 22 - 32 mmol/L   Glucose, Bld 92 70 - 99 mg/dL    Comment: Glucose reference range applies only to samples taken after fasting for at least 8 hours.   BUN 11 6 - 20 mg/dL   Creatinine, Ser 0.88 0.61 - 1.24 mg/dL   Calcium 8.7 (L) 8.9 - 10.3 mg/dL   Total Protein 7.3 6.5 - 8.1 g/dL   Albumin 3.6 3.5 - 5.0 g/dL   AST 19 15 - 41 U/L   ALT 25 0 - 44 U/L   Alkaline Phosphatase 41 38 - 126 U/L   Total Bilirubin 1.1 0.3 - 1.2 mg/dL   GFR, Estimated >60 >60 mL/min    Comment: (NOTE) Calculated using the CKD-EPI Creatinine Equation (2021)    Anion gap 8 5 - 15    Comment: Performed at [facility] [address], [place], [place] [address]  Lactic acid, plasma     Status: None   Collection Time: 09/17/20  7:32 AM  Result Value Ref Range   Lactic Acid, Venous  1.1 0.5 - 1.9 mmol/L    Comment: Performed at [facility] [address], [place], [place] [address]   CT ABDOMEN PELVIS WO  CONTRAST  Result Date: 09/17/2020 CLINICAL DATA:  Abdominal pain and fever. Burning sensation when urinating EXAM: CT ABDOMEN AND PELVIS WITHOUT CONTRAST TECHNIQUE: Multidetector CT imaging of the abdomen and pelvis was performed following the standard protocol without IV contrast. COMPARISON:  06/16/2020 FINDINGS: Lower chest:  No contributory findings. Hepatobiliary: No focal liver abnormality.No evidence of biliary obstruction or stone. Pancreas: Unremarkable. Spleen: Unremarkable. Adrenals/Urinary Tract:  Negative adrenals. Thick walled bladder containing moderate gas with a visible gas containing channel extending superiorly and leftward from the bladder dome towards the sigmoid colon where there was a diverticular abscess previously. No evidence of renal infection, perinephric stranding is chronic and symmetric. Right renal cystic densities. No hydronephrosis. Stomach/Bowel: Colonic diverticulosis which is overall mild. No bowel obstruction. Vascular/Lymphatic: No acute vascular abnormality. No mass or adenopathy. Reproductive:Enlarged prostate with asymmetric leftward and posterior bulging of the capsule. Other: No ascites or pneumoperitoneum. Musculoskeletal: No acute abnormalities. 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 Oral contrast has reached the transverse colon, consider CT or plain film in a few hours to document contrast passage into the bladder. 2. Left posterior bulging of the enlarged prostate, please correlate with PSA. Electronically Signed   By: Monte Fantasia M.D.   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  He does not appear to have active diverticulitis, but is ill secondary to a UTI from his colovesical fistula.   Await urine culture.  We will start zosyn given his WBC of 29K and fevers.  Ideally, this will improve and we can avoid surgical intervention this admission and get him follow up with our colorectal specialist and GI for c-scope prior to surgical intervention.  We discussed if he remains ill, he may require Hartmann's this admission which would result in a possible colostomy bag.  He understands all of this.  His vitals have improved since he has been here.  He is stable to be admitted to a med-surg floor on IV abx therapy.  Will start him on CLD, but likely can advance to soft diet fairly quickly.   FEN - CLD/IVFs VTE - lovenox/SCDs ID - 1 dose of Rocephin in ED, zosyn 5/28 --> Admit - inpatient  Henreitta Cea, [facility] 09/17/2020, 11:33 AM Please see Amion for pager number during day hours 7:00am-4:30pm or 7:00am -11:30am on weekends

## 2020-09-17 NOTE — ED Provider Notes (Signed)
7:23 AM Care assumed from Dr. Leonette Monarch.  At time of transfer of care, patient is awaiting for results of CT abdomen pelvis to look for evidence of a fistula or other acute abnormality in the abdomen and pelvis to explain the patient's sensation of air coming out of his urethra when urinating with UTI.  Anticipate reassessment after work-up to determine if we need to speak with urology or even admission if concerning findings are discovered.  Patient was given more Tylenol for recurrent fever.  9:13 AM CT scan returned showing patient does indeed have a enterovesicular fistulization with a thick-walled bladder containing moderate gas and a gas containing channel from the bladder dome towards the sigmoid colon where the recent diverticular abscess was.  No evidence of renal infection or abscess seen.  There is chronic perinephric stranding which is symmetric.  No bowel obstruction seen.  Will call general surgery to discuss further management given the patient's UTI with fistula and abnormal vital signs earlier.  10:34 AM Spoke to surgery who will come see the patient.  They will see in consultation and follow-up.  They do feel he needs admission to medicine for IV antibiotics to help treat the acute infection and then they will be able to see in consultation to discuss likely surgical repair at some point with the colorectal surgery team.  I discussed with the patient and he agrees.  He does say that he is planning on leaving town to go to [place] on Tuesday however during his admission a good discussion and shared system and conversation between medicine, general surgery, and himself will be needed to determine safety of any future travel with this ongoing.  Patient will be admitted for further management.  We will get a COVID swab.  Clinical Impression: 1. Lower urinary tract infectious disease   2. Enterovesical fistula   3. Abdominal pain, unspecified abdominal location   4. Fever, unspecified  fever cause     Disposition: Admit  This note was prepared with assistance of Dragon voice recognition software. Occasional wrong-word or sound-a-like substitutions may have occurred due to the inherent limitations of voice recognition software.        Alexxa Sabet, Gwenyth Allegra, MD 09/17/20 [id number]

## 2020-09-18 LAB — BASIC METABOLIC PANEL
Anion gap: 7 (ref 5–15)
BUN: 9 mg/dL (ref 6–20)
CO2: 22 mmol/L (ref 22–32)
Calcium: 8.3 mg/dL — ABNORMAL LOW (ref 8.9–10.3)
Chloride: 102 mmol/L (ref 98–111)
Creatinine, Ser: 1.16 mg/dL (ref 0.61–1.24)
GFR, Estimated: >60 mL/min (ref >60)
Glucose, Bld: 107 mg/dL — ABNORMAL HIGH (ref 70–99)
Potassium: 3.7 mmol/L (ref 3.5–5.1)
Sodium: 131 mmol/L — ABNORMAL LOW (ref 135–145)

## 2020-09-18 LAB — CBC
HCT: 42.8 % (ref 39.0–52.0)
Hemoglobin: 14.5 g/dL (ref 13.0–17.0)
MCH: 30.4 pg (ref 26.0–34.0)
MCHC: 33.9 g/dL (ref 30.0–36.0)
MCV: 89.7 fL (ref 80.0–100.0)
Platelets: 318 10*3/uL (ref 150–400)
RBC: 4.77 MIL/uL (ref 4.22–5.81)
RDW: 14.5 % (ref 11.5–15.5)
WBC: 32.4 10*3/uL — ABNORMAL HIGH (ref 4.0–10.5)
nRBC: 0 % (ref 0.0–0.2)

## 2020-09-18 MED ORDER — POLYETHYLENE GLYCOL 3350 17 G PO PACK
17.0000 g | PACK | Freq: Every day | ORAL | Status: DC
  Administered 2020-09-18 – 2020-09-20 (×2): 17 g via ORAL
  Filled 2020-09-18 (×3): qty 1

## 2020-09-18 NOTE — Progress Notes (Signed)
Subjective: Still feels ok, had fever of 102 overnight.  Still with no pain.  Wanting more than CLD.  Still with pneumaturia   ROS: See above, otherwise other systems negative  Objective: Vital signs in last 24 hours: Temp:  [98.8 F (37.1 C)-102.1 F (38.9 C)] 98.8 F (37.1 C) (05/29 0815) Pulse Rate:  [93-116] 93 (05/29 0815) Resp:  [16-18] 18 (05/29 0815) BP: (93-122)/(52-82) 110/70 (05/29 0815) SpO2:  [94 %-100 %] 97 % (05/29 0815) Last BM Date: 09/16/20  Intake/Output from previous day: 05/28 0701 - 05/29 0700 In: -  Out: 400 [Urine:400] Intake/Output this shift: No intake/output data recorded.  PE: Heart: regular Lungs: CTAB Abd: soft, NT, Nd, +BS  Lab Results:  Recent Labs    09/16/20 2323 09/18/20 0314  WBC 29.9* 32.4*  HGB 15.3 14.5  HCT 44.3 42.8  PLT 359 318   BMET Recent Labs    09/16/20 2323 09/18/20 0314  NA 135 131*  K 4.0 3.7  CL 104 102  CO2 23 22  GLUCOSE 92 107*  BUN 11 9  CREATININE 0.88 1.16  CALCIUM 8.7* 8.3*   PT/INR No results for input(s): LABPROT, INR in the last 72 hours. CMP     Component Value Date/Time   NA 131 (L) 09/18/2020 0314   K 3.7 09/18/2020 0314   CL 102 09/18/2020 0314   CO2 22 09/18/2020 0314   GLUCOSE 107 (H) 09/18/2020 0314   BUN 9 09/18/2020 0314   CREATININE 1.16 09/18/2020 0314   CALCIUM 8.3 (L) 09/18/2020 0314   PROT 7.3 09/16/2020 2323   ALBUMIN 3.6 09/16/2020 2323   AST 19 09/16/2020 2323   ALT 25 09/16/2020 2323   ALKPHOS 41 09/16/2020 2323   BILITOT 1.1 09/16/2020 2323   GFRNONAA >60 09/18/2020 0314   Lipase     Component Value Date/Time   LIPASE 26 06/03/2020 1236       Studies/Results: CT ABDOMEN PELVIS WO CONTRAST  Result Date: 09/17/2020 CLINICAL DATA:  Abdominal pain and fever. Burning sensation when urinating EXAM: CT ABDOMEN AND PELVIS WITHOUT CONTRAST TECHNIQUE: Multidetector CT imaging of the abdomen and pelvis was performed following the standard protocol  without IV contrast. COMPARISON:  06/16/2020 FINDINGS: Lower chest:  No contributory findings. Hepatobiliary: No focal liver abnormality.No evidence of biliary obstruction or stone. Pancreas: Unremarkable. Spleen: Unremarkable. Adrenals/Urinary Tract:  Negative adrenals. Thick walled bladder containing moderate gas with a visible gas containing channel extending superiorly and leftward from the bladder dome towards the sigmoid colon where there was a diverticular abscess previously. No evidence of renal infection, perinephric stranding is chronic and symmetric. Right renal cystic densities. No hydronephrosis. Stomach/Bowel: Colonic diverticulosis which is overall mild. No bowel obstruction. Vascular/Lymphatic: No acute vascular abnormality. No mass or adenopathy. Reproductive:Enlarged prostate with asymmetric leftward and posterior bulging of the capsule. Other: No ascites or pneumoperitoneum. Musculoskeletal: No acute abnormalities. 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 Oral contrast has reached the transverse colon, consider CT or plain film in a few hours to document contrast passage into the bladder. 2. Left posterior bulging of the enlarged prostate, please correlate with PSA. Electronically Signed   By: Monte Fantasia M.D.   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   -cx with >100,000 of gram negative rods.  Cont zosyn as this should cover unless  sensitivities reveal otherwise. -soft diet -daily miralax -will need outpatient c-scope and CR follow up  FEN - soft diet, SLIV VTE - lovenox ID - zosyn 5/28 -->   LOS: 1 day    Henreitta Cea , [facility] 09/18/2020, 10:32 AM Please see Amion for pager number during day hours 7:00am-4:30pm or 7:00am -11:30am on weekends

## 2020-09-19 LAB — CBC
HCT: 41.3 % (ref 39.0–52.0)
Hemoglobin: 14.3 g/dL (ref 13.0–17.0)
MCH: 30.7 pg (ref 26.0–34.0)
MCHC: 34.6 g/dL (ref 30.0–36.0)
MCV: 88.6 fL (ref 80.0–100.0)
Platelets: 291 10*3/uL (ref 150–400)
RBC: 4.66 MIL/uL (ref 4.22–5.81)
RDW: 14.4 % (ref 11.5–15.5)
WBC: 21.9 10*3/uL — ABNORMAL HIGH (ref 4.0–10.5)
nRBC: 0 % (ref 0.0–0.2)

## 2020-09-19 LAB — URINE CULTURE: Culture: >=100000 — AB

## 2020-09-19 MED ORDER — CALCIUM CARBONATE ANTACID 500 MG PO CHEW
1.0000 | CHEWABLE_TABLET | Freq: Two times a day (BID) | ORAL | Status: DC
  Filled 2020-09-19: qty 1

## 2020-09-19 MED ORDER — CALCIUM CARBONATE ANTACID 500 MG PO CHEW
1.0000 | CHEWABLE_TABLET | Freq: Two times a day (BID) | ORAL | Status: DC
  Administered 2020-09-19 – 2020-09-20 (×3): 200 mg via ORAL
  Filled 2020-09-19 (×3): qty 1

## 2020-09-19 NOTE — Progress Notes (Signed)
Pt just returned to unit with girlfriend. Pt had left the unit with girlfriend after previous shift  nurse advising pt if needing to leave unit pt would need to sign AMA form and was not to leave due to hospital policy. Pt stated to previous shift nurse he wanted to go out to smoke, pt has been refusing nicoderm patch. Despite pt being advised if needing to leave unit AMA form to be signed pt still left unit with girlfriend. Pt upon return to unit stated he went to parking lot to his car. Pt was explained liability risk if injury happens  out of unit parameters, again hospital policy if needing to leave unit again will need to sign  AMA form as per pt stated  will not leave unit again and apologized.Marland Kitchen

## 2020-09-19 NOTE — Progress Notes (Signed)
Pt left pt room not found in the unit. Pt given report by previous nurse on  pt request to leave the unit.Pt was informed AMA form to be signed prior to leaving unit Pt refused sign form .Hospital police [facility] was informed.

## 2020-09-19 NOTE — Progress Notes (Signed)
       Subjective: Feels well.  No further fevers except mild at 100.5 last night.  Eating well.  Moved his bowels 2x.  ROS: See above, otherwise other systems negative  Objective: Vital signs in last 24 hours: Temp:  [98.6 F (37 C)-100.7 F (38.2 C)] 98.6 F (37 C) (05/30 0541) Pulse Rate:  [81-94] 81 (05/30 0541) Resp:  [17-18] 17 (05/30 0541) BP: (104-127)/(64-76) 114/76 (05/30 0541) SpO2:  [97 %-98 %] 98 % (05/30 0541) Last BM Date: 09/16/20  Intake/Output from previous day: No intake/output data recorded. Intake/Output this shift: No intake/output data recorded.  PE: Heart: regular Lungs: CTAB Abd: soft, NT, Nd, +BS  Lab Results:  Recent Labs    09/18/20 0314 09/19/20 0558  WBC 32.4* 21.9*  HGB 14.5 14.3  HCT 42.8 41.3  PLT 318 291   BMET Recent Labs    09/16/20 2323 09/18/20 0314  NA 135 131*  K 4.0 3.7  CL 104 102  CO2 23 22  GLUCOSE 92 107*  BUN 11 9  CREATININE 0.88 1.16  CALCIUM 8.7* 8.3*   PT/INR No results for input(s): LABPROT, INR in the last 72 hours. CMP     Component Value Date/Time   NA 131 (L) 09/18/2020 0314   K 3.7 09/18/2020 0314   CL 102 09/18/2020 0314   CO2 22 09/18/2020 0314   GLUCOSE 107 (H) 09/18/2020 0314   BUN 9 09/18/2020 0314   CREATININE 1.16 09/18/2020 0314   CALCIUM 8.3 (L) 09/18/2020 0314   PROT 7.3 09/16/2020 2323   ALBUMIN 3.6 09/16/2020 2323   AST 19 09/16/2020 2323   ALT 25 09/16/2020 2323   ALKPHOS 41 09/16/2020 2323   BILITOT 1.1 09/16/2020 2323   GFRNONAA >60 09/18/2020 0314   Lipase     Component Value Date/Time   LIPASE 26 06/03/2020 1236       Studies/Results: No results found.  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  Recheck in am -cx with E coli, resistant to amp, will likely put on cipro/flagyl at DC -cont IV zosyn today and if WBC still trending down, likely transition to oral abx tomorrow -soft diet -daily miralax -will need outpatient c-scope and CR follow up  FEN - soft diet, SLIV VTE - lovenox ID - zosyn 5/28 -->   LOS: 2 days    Henreitta Cea , [facility] Surgery 09/19/2020, 9:05 AM Please see Amion for pager number during day hours 7:00am-4:30pm or 7:00am -11:30am on weekends

## 2020-09-20 LAB — CBC
HCT: 44.5 % (ref 39.0–52.0)
Hemoglobin: 15.6 g/dL (ref 13.0–17.0)
MCH: 30.6 pg (ref 26.0–34.0)
MCHC: 35.1 g/dL (ref 30.0–36.0)
MCV: 87.4 fL (ref 80.0–100.0)
Platelets: 333 10*3/uL (ref 150–400)
RBC: 5.09 MIL/uL (ref 4.22–5.81)
RDW: 14.2 % (ref 11.5–15.5)
WBC: 13 10*3/uL — ABNORMAL HIGH (ref 4.0–10.5)
nRBC: 0 % (ref 0.0–0.2)

## 2020-09-20 MED ORDER — CIPROFLOXACIN HCL 500 MG PO TABS: 500.0000 mg | ORAL_TABLET | Freq: Two times a day (BID) | ORAL | 0 refills | Status: AC

## 2020-09-20 MED ORDER — METRONIDAZOLE 500 MG PO TABS: 500.0000 mg | ORAL_TABLET | Freq: Three times a day (TID) | ORAL | 0 refills | Status: AC

## 2020-09-20 NOTE — Discharge Instructions (Signed)
You will continue to "Cerulean air."  This is a result of the connection or fistula you have from your colon to your bladder.  Please call to let us know if you develop further issues voiding or recurrent pain, fevers, etc.  High-Fiber Eating Plan Fiber, also called dietary fiber, is a type of carbohydrate. It is found foods such as fruits, vegetables, whole grains, and beans. A high-fiber diet can have many health benefits. Your health care provider may recommend a high-fiber diet to help:  Prevent constipation. Fiber can make your bowel movements more regular.  Lower your cholesterol.  Relieve the following conditions: ? Inflammation of veins in the anus (hemorrhoids). ? Inflammation of specific areas of the digestive tract (uncomplicated diverticulosis). ? A problem of the large intestine, also called the colon, that sometimes causes pain and diarrhea (irritable bowel syndrome, or IBS).  Prevent overeating as part of a weight-loss plan.  Prevent heart disease, type 2 diabetes, and certain cancers. What are tips for following this plan? Reading food labels  Check the nutrition facts label on food products for the amount of dietary fiber. Choose foods that have 5 grams of fiber or more per serving.  The goals for recommended daily fiber intake include: ? Men (age 40 or younger): 34-38 g. ? Men (over age 81): 28-34 g. ? Women (age 13 or younger): 25-28 g. ? Women (over age 66): 22-25 g. Your daily fiber goal is _____________ g.   Shopping  Choose whole fruits and vegetables instead of processed forms, such as apple juice or applesauce.  Choose a wide variety of high-fiber foods such as avocados, lentils, oats, and kidney beans.  Read the nutrition facts label of the foods you choose. Be aware of foods with added fiber. These foods often have high sugar and sodium amounts per serving. Cooking  Use whole-grain flour for baking and cooking.  Cook with brown rice instead of white  rice. Meal planning  Start the day with a breakfast that is high in fiber, such as a cereal that contains 5 g of fiber or more per serving.  Eat breads and cereals that are made with whole-grain flour instead of refined flour or white flour.  Eat brown rice, bulgur wheat, or millet instead of white rice.  Use beans in place of meat in soups, salads, and pasta dishes.  Be sure that half of the grains you eat each day are whole grains. General information  You can get the recommended daily intake of dietary fiber by: ? Eating a variety of fruits, vegetables, grains, nuts, and beans. ? Taking a fiber supplement if you are not able to take in enough fiber in your diet. It is better to get fiber through food than from a supplement.  Gradually increase how much fiber you consume. If you increase your intake of dietary fiber too quickly, you may have bloating, cramping, or gas.  Drink plenty of water to help you digest fiber.  Choose high-fiber snacks, such as berries, raw vegetables, nuts, and popcorn. What foods should I eat? Fruits Berries. Pears. Apples. Oranges. Avocado. Prunes and raisins. Dried figs. Vegetables Sweet potatoes. Spinach. Kale. Artichokes. Cabbage. Broccoli. Cauliflower. Green peas. Carrots. Squash. Grains Whole-grain breads. Multigrain cereal. Oats and oatmeal. Brown rice. Barley. Bulgur wheat. [place]. Quinoa. Bran muffins. Popcorn. Rye wafer crackers. Meats and other proteins Navy beans, kidney beans, and pinto beans. Soybeans. Split peas. Lentils. Nuts and seeds. Dairy Fiber-fortified yogurt. Beverages Fiber-fortified soy milk. Fiber-fortified orange juice. Other  foods Fiber bars. The items listed above may not be a complete list of recommended foods and beverages. Contact a dietitian for more information. What foods should I avoid? Fruits Fruit juice. Cooked, strained fruit. Vegetables Fried potatoes. Canned vegetables. Well-cooked  vegetables. Grains White bread. Pasta made with refined flour. White rice. Meats and other proteins Fatty cuts of meat. Fried chicken or fried fish. Dairy Milk. Yogurt. Cream cheese. Sour cream. Fats and oils Butters. Beverages Soft drinks. Other foods Cakes and pastries. The items listed above may not be a complete list of foods and beverages to avoid. Talk with your dietitian about what choices are best for you. Summary  Fiber is a type of carbohydrate. It is found in foods such as fruits, vegetables, whole grains, and beans.  A high-fiber diet has many benefits. It can help to prevent constipation, lower blood cholesterol, aid weight loss, and reduce your risk of heart disease, diabetes, and certain cancers.  Increase your intake of fiber gradually. Increasing fiber too quickly may cause cramping, bloating, and gas. Drink plenty of water while you increase the amount of fiber you consume.  The best sources of fiber include whole fruits and vegetables, whole grains, nuts, seeds, and beans. This information is not intended to replace advice given to you by your health care provider. Make sure you discuss any questions you have with your health care provider. Document Revised: 08/13/2019 Document Reviewed: 08/13/2019 Elsevier Patient Education  2021 [employer].

## 2020-09-20 NOTE — Progress Notes (Signed)
Discharge teaching complete. Meds, diet, activity, follow up appointments reviewed and all questions answered. Copy of instructions given to patient and prescriptions sent to pharmacy. Patient discharged via wheelchair to car in ED parking lot.

## 2020-09-20 NOTE — Discharge Summary (Signed)
Patient ID: William Boyd [id number] 1960-12-20 60 y.o.  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 UTI (urinary tract infection) 09/17/2020  . 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  He tolerated this well and rapidly improved.  He followed up with IR for drain injection which was negative and had his drain removed in late February.  He was set up to see Dr. Donne Boyd in the office on 06/21/20, but he was feeling great and did not show up.  He has continued to feel well since that time with no abdominal pain or issues.  He has been eating well and moving his bowels normally.  He has never had a colonoscopy.  Several days ago, he began having some burning and dysuria.  This has persisted and yesterday he had a significant amount of pneumaturia which frightened him and therefore he presented to the [facility] for evaluation.  He had started running fevers of 101 yesterday at night.  Nothing helped his urinary symptoms, despite drinking cranberry juice at home.  He denies any pain whatsoever in his abdomen.  He has been moving his bowels with no blood present.  Here in the ED he has been found to have a fever of 101.7 at max, initial tachycardia, but this is improving, as well as a WBC of 29K.  He has a + UA for a UTI.  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  We have been asked to evaluate the patient for further recommendations.  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  He responded well to abx therapy.  Cultures revealed E coli which was resistant to ampicillin.  On HD 4 his WBC had trended down to 13K and he was feeling significantly improved.  He was transitioned to oral cipro/flagyl for home to complete a 7 day course of abx therapy.  He did not have active diverticulitis during this admission.  We will arrange outpatient referral to GI for a colonoscopy and get him set up with one of our colorectal specialist to discuss further surgical options.  He was otherwise stable at this point for DC home.  Physical Exam: Heart: regular Lungs: CTAB Abd: soft, NT, ND, +BS  Allergies as of 09/20/2020   No Known Allergies     Medication List    STOP taking these medications   amoxicillin-clavulanate 875-125 MG tablet Commonly known as: AUGMENTIN     TAKE these medications   acetaminophen 325 MG tablet Commonly known as: TYLENOL Take 2 tablets (650 mg total) by mouth every 6 (six) hours as needed for mild pain (or temp > 100).   ciprofloxacin 500 MG tablet Commonly known as: Cipro Take 1 tablet (500 mg total) by mouth 2 (two) times daily for 4 days.   ibuprofen 200 MG tablet Commonly known as: ADVIL Take 200 mg by mouth every 6 (six) hours as needed for headache (pain).   metroNIDAZOLE 500 MG  tablet Commonly known as: Flagyl Take 1 tablet (500 mg total) by mouth 3 (three) times daily for 4 days.   multivitamin with minerals Tabs tablet Take 1 tablet by mouth daily.         Follow-up Information    [facility], [place] Follow up in 3 week(s).   Specialty: General Surgery Why: our office is working on your follow up as well as a GI referral for a colonoscopy.  They should be in contact with you soon.  Please call if you have not heard from [place] by the end of the week. Contact information: [address] [place] [address] [phone]         [facility] Follow up.   Specialty: Gastroenterology Why: our office is making a referral for a colonoscopy Contact information: [place] [id number] [phone]              Signed: Saverio Boyd, [facility] 09/20/2020, 10:58 AM Please see Amion for pager number during day hours 7:00am-4:30pm, 7-11:30am on Weekends

## 2020-11-02 ENCOUNTER — Ambulatory Visit (INDEPENDENT_AMBULATORY_CARE_PROVIDER_SITE_OTHER): Payer: Managed Care, Other (non HMO) | Admitting: Gastroenterology

## 2020-11-02 ENCOUNTER — Encounter: Payer: Self-pay | Admitting: Gastroenterology

## 2020-11-02 VITALS — BP 118/80 | HR 85 | Ht 69.0 in | Wt 198.0 lb

## 2020-11-02 DIAGNOSIS — N321 Vesicointestinal fistula: Secondary | ICD-10-CM | POA: Diagnosis not present

## 2020-11-02 DIAGNOSIS — K5732 Diverticulitis of large intestine without perforation or abscess without bleeding: Secondary | ICD-10-CM

## 2020-11-02 NOTE — Progress Notes (Signed)
HPI : William Boyd is a very pleasant 60 year old African-American male referred by [facility] surgery Saverio Danker, [place]) to schedule a colonoscopy prior to potential interventions for a colovesicular fistula.  The patient experienced a complicated episode of left-sided diverticulitis in February 2022, which was treated with percutaneous drain and antibiotics.  He recovered quickly from this and had no further problems until May 2022 when he presented with fevers and dysuria.  CT scan revealed a colovesicular fistula without active diverticulitis and he was treated with antibiotics alone.  His symptoms improved quickly with antibiotic therapy and he is remained essentially asymptomatic since then.  The patient does admit to seeing occasional air bubbles in his urine ever since his February admission, but this is infrequent.  The last time he noted pneumaturia was about 3 weeks ago.  He denies any symptoms of dysuria, urinary frequency, brown discoloration to the urine or fecal matter in the urine.  He also denies any chronic GI symptoms to include abdominal pain, constipation, diarrhea or blood in the stool.  He has never had a colonoscopy.  There is no known family history of GI malignancy or diverticular complications.   Past Medical History:  Diagnosis Date   Diverticulitis of large intestine with abscess 05/2020     Past Surgical History:  Procedure Laterality Date   HAND SURGERY     IR RADIOLOGIST EVAL & MGMT  06/16/2020   History reviewed. No pertinent family history. Social History   Tobacco Use   Smoking status: Every Day    Packs/day: 1.00    Pack years: 0.00    Types: Cigarettes   Smokeless tobacco: Never  Vaping Use   Vaping Use: Never used  Substance Use Topics   Alcohol use: Not Currently   Drug use: Never   Current Outpatient Medications  Medication Sig Dispense Refill   acetaminophen (TYLENOL) 325 MG tablet Take 2 tablets (650 mg total) by mouth every 6 (six)  hours as needed for mild pain (or temp > 100).     ibuprofen (ADVIL) 200 MG tablet Take 200 mg by mouth every 6 (six) hours as needed for headache (pain).     Multiple Vitamin (MULTIVITAMIN WITH MINERALS) TABS tablet Take 1 tablet by mouth daily.     No current facility-administered medications for this visit.   No Known Allergies   Review of Systems: All systems reviewed and negative except where noted in HPI.    No results found.  Physical Exam: BP 118/80   Pulse 85   Ht 5\' 9"  (1.753 m)   Wt 198 lb (89.8 kg)   SpO2 96%   BMI 29.24 kg/m  Constitutional: Pleasant,well-developed, African-American male in no acute distress. HEENT: Normocephalic and atraumatic. Conjunctivae are normal. No scleral icterus. Neck supple.  Cardiovascular: Normal rate, regular rhythm.  Pulmonary/chest: Effort normal and breath sounds normal. No wheezing, rales or rhonchi. Abdominal: Soft, nondistended, nontender. Bowel sounds active throughout. There are no masses palpable. No hepatomegaly. Extremities: no edema Lymphadenopathy: No cervical adenopathy noted. Neurological: Alert and oriented to person place and time. Skin: Skin is warm and dry. No rashes noted. Psychiatric: Normal mood and affect. Behavior is normal.  CBC    Component Value Date/Time   WBC 13.0 (H) 09/20/2020 0958   RBC 5.09 09/20/2020 0958   HGB 15.6 09/20/2020 0958   HCT 44.5 09/20/2020 0958   PLT 333 09/20/2020 0958   MCV 87.4 09/20/2020 0958   MCH 30.6 09/20/2020 0958   MCHC 35.1  09/20/2020 0958   RDW 14.2 09/20/2020 0958   LYMPHSABS 2.0 09/16/2020 2323   MONOABS 2.0 (H) 09/16/2020 2323   EOSABS 0.1 09/16/2020 2323   BASOSABS 0.1 09/16/2020 2323    CMP     Component Value Date/Time   NA 131 (L) 09/18/2020 0314   K 3.7 09/18/2020 0314   CL 102 09/18/2020 0314   CO2 22 09/18/2020 0314   GLUCOSE 107 (H) 09/18/2020 0314   BUN 9 09/18/2020 0314   CREATININE 1.16 09/18/2020 0314   CALCIUM 8.3 (L) 09/18/2020 0314    PROT 7.3 09/16/2020 2323   ALBUMIN 3.6 09/16/2020 2323   AST 19 09/16/2020 2323   ALT 25 09/16/2020 2323   ALKPHOS 41 09/16/2020 2323   BILITOT 1.1 09/16/2020 2323   GFRNONAA >60 09/18/2020 0314     ASSESSMENT AND PLAN: 60 year old male with history of complicated diverticulitis in February 2022 with subsequent development of a colovesicular fistula, identified in May 2022 after presentation with uroseptic symptoms.  He has never had a colonoscopy.  He is currently asymptomatic on no antibiotic therapy.  He states that the surgeon is potentially planning on closing the fistula with an occlusive glue vs resection, but a colonoscopy will need to be performed prior to that to rule out malignancy or other pathology such as Crohn's disease.  The details, risks (including bleeding, perforation, infection, missed lesions, medication reactions and possible hospitalization or surgery if complications occur), benefits, and alternatives to colonoscopy with possible biopsy and possible polypectomy were discussed with the patient and he/ consents to proceed.     Clovis Riley, MD

## 2020-11-02 NOTE — Patient Instructions (Signed)
If you are age 59 or older, your body mass index should be between 23-30. Your Body mass index is 29.24 kg/m. If this is out of the aforementioned range listed, please consider follow up with your Primary Care Provider.  If you are age 12 or younger, your body mass index should be between 19-25. Your Body mass index is 29.24 kg/m. If this is out of the aformentioned range listed, please consider follow up with your Primary Care Provider.   You have been scheduled for a colonoscopy. Please follow written instructions given to you at your visit today.  Please pick up your prep supplies at the pharmacy within the next 1-3 days. If you use inhalers (even only as needed), please bring them with you on the day of your procedure.   The [facility] providers would like to encourage you to use [facility] to communicate with providers for non-urgent requests or questions.  Due to long hold times on the telephone, sending your provider a message by [facility] may be a faster and more efficient way to get a response.  Please allow 48 business hours for a response.  Please remember that this is for non-urgent requests.   It was a pleasure to see you today!  Thank you for trusting me with your gastrointestinal care!    Scott E. Candis Schatz, MD

## 2020-11-04 ENCOUNTER — Other Ambulatory Visit: Payer: Self-pay

## 2020-11-04 ENCOUNTER — Encounter: Payer: Self-pay | Admitting: Gastroenterology

## 2020-11-04 ENCOUNTER — Ambulatory Visit (AMBULATORY_SURGERY_CENTER): Payer: Managed Care, Other (non HMO) | Admitting: Gastroenterology

## 2020-11-04 VITALS — BP 107/77 | HR 56 | Temp 97.3°F | Resp 16 | Ht 69.0 in | Wt 198.0 lb

## 2020-11-04 DIAGNOSIS — D123 Benign neoplasm of transverse colon: Secondary | ICD-10-CM | POA: Diagnosis not present

## 2020-11-04 DIAGNOSIS — D128 Benign neoplasm of rectum: Secondary | ICD-10-CM | POA: Diagnosis not present

## 2020-11-04 DIAGNOSIS — D124 Benign neoplasm of descending colon: Secondary | ICD-10-CM | POA: Diagnosis not present

## 2020-11-04 DIAGNOSIS — D122 Benign neoplasm of ascending colon: Secondary | ICD-10-CM

## 2020-11-04 DIAGNOSIS — K5732 Diverticulitis of large intestine without perforation or abscess without bleeding: Secondary | ICD-10-CM | POA: Diagnosis not present

## 2020-11-04 DIAGNOSIS — D120 Benign neoplasm of cecum: Secondary | ICD-10-CM

## 2020-11-04 DIAGNOSIS — D125 Benign neoplasm of sigmoid colon: Secondary | ICD-10-CM | POA: Diagnosis not present

## 2020-11-04 DIAGNOSIS — D121 Benign neoplasm of appendix: Secondary | ICD-10-CM | POA: Diagnosis not present

## 2020-11-04 DIAGNOSIS — N321 Vesicointestinal fistula: Secondary | ICD-10-CM

## 2020-11-04 MED ORDER — SODIUM CHLORIDE 0.9 % IV SOLN: 500.0000 mL | Freq: Once | INTRAVENOUS | Status: DC

## 2020-11-04 NOTE — Progress Notes (Signed)
A and O x3. Report to RN. Tolerated MAC anesthesia well. 

## 2020-11-04 NOTE — Op Note (Signed)
[facility] Patient Name: William Boyd Procedure Date: 11/04/2020 1:28 PM MRN: [id number] Endoscopist: Nicki Reaper E. Candis Schatz , MD Age: 60 Referring MD:  Date of Birth: 1961/04/23 Gender: Male Account #: [id number] Procedure:                Colonoscopy Indications:              Follow-up of diverticulitis, Colovesical fistula Medicines:                Monitored Anesthesia Care Procedure:                Pre-Anesthesia Assessment:                           - Prior to the procedure, a History and Physical                            was performed, and patient medications and                            allergies were reviewed. The patient's tolerance of                            previous anesthesia was also reviewed. The risks                            and benefits of the procedure and the sedation                            options and risks were discussed with the patient.                            All questions were answered, and informed consent                            was obtained. Prior Anticoagulants: The patient has                            taken no previous anticoagulant or antiplatelet                            agents. ASA Grade Assessment: II - A patient with                            mild systemic disease. After reviewing the risks                            and benefits, the patient was deemed in                            satisfactory condition to undergo the procedure.                           After obtaining informed consent, the colonoscope  was passed under direct vision. Throughout the                            procedure, the patient's blood pressure, pulse, and                            oxygen saturations were monitored continuously. The                            CF [id number] #[id number] was introduced through the anus                            and advanced to the the terminal ileum, with                             identification of the appendiceal orifice and IC                            valve. The colonoscopy was performed without                            difficulty. The patient tolerated the procedure                            well. The quality of the bowel preparation was                            adequate. The terminal ileum, ileocecal valve,                            appendiceal orifice, and rectum were photographed. Scope In: 1:33:52 PM Scope Out: 2:14:54 PM Scope Withdrawal Time: 0 hours 37 minutes 24 seconds  Total Procedure Duration: 0 hours 41 minutes 2 seconds  Findings:                 The perianal and digital rectal examinations were                            normal. Pertinent negatives include normal                            sphincter tone and no palpable rectal lesions.                           A 2 mm polyp was found in the appendiceal orifice.                            The polyp was sessile. The polyp was removed with a                            cold snare. Resection and retrieval were complete.                           A 3 mm  polyp was found in the cecum. The polyp was                            sessile. The polyp was removed with a cold snare.                            Resection and retrieval were complete. Estimated                            blood loss was minimal.                           A 7 mm polyp was found in the ascending colon. The                            polyp was flat. The polyp was removed with a cold                            snare. Resection and retrieval were complete.                            Estimated blood loss was minimal.                           A 4 mm polyp was found in the hepatic flexure. The                            polyp was sessile. The polyp was removed with a                            cold snare. Resection and retrieval were complete.                            Estimated blood loss was minimal.                           Two  sessile polyps were found in the transverse                            colon. The polyps were 4 to 5 mm in size. These                            polyps were removed with a cold snare. Resection                            and retrieval were complete. Estimated blood loss                            was minimal.                           A single (solitary) one mm ulcer was found in the  descending colon. No bleeding was present. Biopsies                            were taken with a cold forceps for histology.                            Estimated blood loss was minimal.                           A 10 mm polyp was found in the sigmoid colon. The                            polyp was pedunculated. The polyp was removed with                            a hot snare. Resection and retrieval were complete.                            Estimated blood loss: none.                           Two sessile polyps were found in the rectum. The                            polyps were 2 to 7 mm in size. These polyps were                            removed with a cold snare. Resection and retrieval                            were complete. Estimated blood loss was minimal.                           Many small-mouthed diverticula were found in the                            sigmoid colon, descending colon and transverse                            colon.                           The exam was otherwise normal throughout the                            examined colon.                           The terminal ileum appeared normal.                           The retroflexed view of the distal rectum and anal  verge was normal and showed no anal or rectal                            abnormalities. Complications:            No immediate complications. Estimated Blood Loss:     Estimated blood loss was minimal. Impression:               - One 2 mm polyp at the appendiceal  orifice,                            removed with a cold snare. Resected and retrieved.                           - One 3 mm polyp in the cecum, removed with a cold                            snare. Resected and retrieved.                           - One 7 mm polyp in the ascending colon, removed                            with a cold snare. Resected and retrieved.                           - One 4 mm polyp at the hepatic flexure, removed                            with a cold snare. Resected and retrieved.                           - Two 4 to 5 mm polyps in the transverse colon,                            removed with a cold snare. Resected and retrieved.                           - A single (solitary) ulcer in the descending                            colon. Biopsied.                           - One 10 mm polyp in the sigmoid colon, removed                            with a hot snare. Resected and retrieved.                           - Two 2 to 7 mm polyps in the rectum, removed with  a cold snare. Resected and retrieved.                           - Diverticulosis in the sigmoid colon, in the                            descending colon and in the transverse colon.                            Diverticular burden minimal proximal to descending                            colon.                           - The examined portion of the ileum was normal.                           - The distal rectum and anal verge are normal on                            retroflexion view.                           - Fistulous opening not seen during this colonoscopy Recommendation:           - Patient has a contact number available for                            emergencies. The signs and symptoms of potential                            delayed complications were discussed with the                            patient. Return to normal activities tomorrow.                             Written discharge instructions were provided to the                            patient.                           - Resume previous diet.                           - Continue present medications.                           - Await pathology results.                           - Repeat colonoscopy (date not yet determined) for  surveillance based on pathology results.                           - Follow up with colorectal surgery to discuss                            potential treatments for colovesicular fistula. Gwendolin Briel E. Candis Schatz, MD 11/04/2020 2:28:10 PM This report has been signed electronically.

## 2020-11-04 NOTE — Progress Notes (Signed)
Called to room to assist during endoscopic procedure.  Patient ID and intended procedure confirmed with present staff. Received instructions for my participation in the procedure from the performing physician.  

## 2020-11-04 NOTE — Progress Notes (Signed)
VS taken by [place] 

## 2020-11-04 NOTE — Patient Instructions (Addendum)
Handouts on polyps ,diverticulosis and hemorrhoids given to you today  Await pathology results on polyps removed & on biopsies done on descending colon ulcer    YOU HAD AN ENDOSCOPIC PROCEDURE TODAY AT [facility]:   Refer to the procedure report that was given to you for any specific questions about what was found during the examination.  If the procedure report does not answer your questions, please call your gastroenterologist to clarify.  If you requested that your care partner not be given the details of your procedure findings, then the procedure report has been included in a sealed envelope for you to review at your convenience later.  YOU SHOULD EXPECT: Some feelings of bloating in the abdomen. Passage of more gas than usual.  Walking can help get rid of the air that was put into your GI tract during the procedure and reduce the bloating. If you had a lower endoscopy (such as a colonoscopy or flexible sigmoidoscopy) you may notice spotting of blood in your stool or on the toilet paper. If you underwent a bowel prep for your procedure, you may not have a normal bowel movement for a few days.  Please Note:  You might notice some irritation and congestion in your nose or some drainage.  This is from the oxygen used during your procedure.  There is no need for concern and it should clear up in a day or so.  SYMPTOMS TO REPORT IMMEDIATELY:  Following lower endoscopy (colonoscopy or flexible sigmoidoscopy):  Excessive amounts of blood in the stool  Significant tenderness or worsening of abdominal pains  Swelling of the abdomen that is new, acute  Fever of 100F or higher   For urgent or emergent issues, a gastroenterologist can be reached at any hour by calling [phone]. Do not use MyChart messaging for urgent concerns.    DIET:  We do recommend a small meal at first, but then you may proceed to your regular diet.  Drink plenty of fluids but you should avoid  alcoholic beverages for 24 hours.  ACTIVITY:  You should plan to take it easy for the rest of today and you should NOT DRIVE or use heavy machinery until tomorrow (because of the sedation medicines used during the test).    FOLLOW UP: Our staff will call the number listed on your records 48-72 hours following your procedure to check on you and address any questions or concerns that you may have regarding the information given to you following your procedure. If we do not reach you, we will leave a message.  We will attempt to reach you two times.  During this call, we will ask if you have developed any symptoms of COVID 19. If you develop any symptoms (ie: fever, flu-like symptoms, shortness of breath, cough etc.) before then, please call [phone].  If you test positive for Covid 19 in the 2 weeks post procedure, please call and report this information to [place].    If any biopsies were taken you will be contacted by phone or by letter within the next 1-3 weeks.  Please call us at [phone] if you have not heard about the biopsies in 3 weeks.    SIGNATURES/CONFIDENTIALITY: You and/or your care partner have signed paperwork which will be entered into your electronic medical record.  These signatures attest to the fact that that the information above on your After Visit Summary has been reviewed and is understood.  Full responsibility of the confidentiality  of this discharge information lies with you and/or your care-partner.  

## 2020-11-07 ENCOUNTER — Ambulatory Visit: Payer: Self-pay | Admitting: Surgery

## 2020-11-07 ENCOUNTER — Encounter: Payer: Self-pay | Admitting: Surgery

## 2020-11-07 DIAGNOSIS — N321 Vesicointestinal fistula: Secondary | ICD-10-CM | POA: Insufficient documentation

## 2020-11-08 ENCOUNTER — Telehealth: Payer: Self-pay | Admitting: *Deleted

## 2020-11-08 NOTE — Telephone Encounter (Signed)
  Follow up Call-  Call back number 11/04/2020  Post procedure Call Back phone  # [phone]  Permission to leave phone message Yes     Patient questions:  Message left to call if necessary.

## 2020-11-08 NOTE — Telephone Encounter (Signed)
  Follow up Call-  Call back number 11/04/2020  Post procedure Call Back phone  # [phone]  Permission to leave phone message Yes     Patient questions:  Do you have a fever, pain , or abdominal swelling? No. Pain Score  0 *  Have you tolerated food without any problems? Yes.    Have you been able to return to your normal activities? Yes.    Do you have any questions about your discharge instructions: Diet   No. Medications  No. Follow up visit  No.  Do you have questions or concerns about your Care? No.  Actions: * If pain score is 4 or above: No action needed, pain <4.  Have you developed a fever since your procedure? no  2.   Have you had an respiratory symptoms (SOB or cough) since your procedure? no  3.   Have you tested positive for COVID 19 since your procedure no  4.   Have you had any family members/close contacts diagnosed with the COVID 19 since your procedure?  no   If yes to any of these questions please route to Joylene John, RN and Joella Prince, RN

## 2020-11-14 ENCOUNTER — Encounter: Payer: Self-pay | Admitting: Gastroenterology

## 2022-03-12 IMAGING — CT CT IMAGE GUIDED DRAINAGE BY PERCUTANEOUS CATHETER
1 of 2 series · 14 of 32 positions shown, 19 images · non-contrast
Comparison: none

INDICATION: Left lower quadrant diverticular abscess

[Series 2: i-spiral 5.0 b40f · axial · 0.81mm/px · z∈[+752,+903]mm · 14 of 49 slices shown, 19 images]
[im 3/49  soft-tissue]
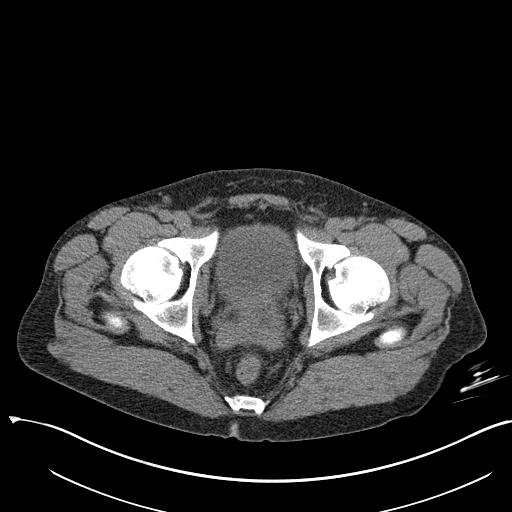
[im 3/49  bone]
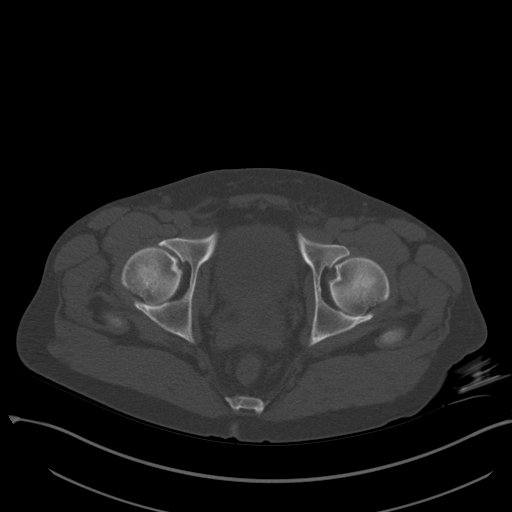
[im 7/49  soft-tissue]
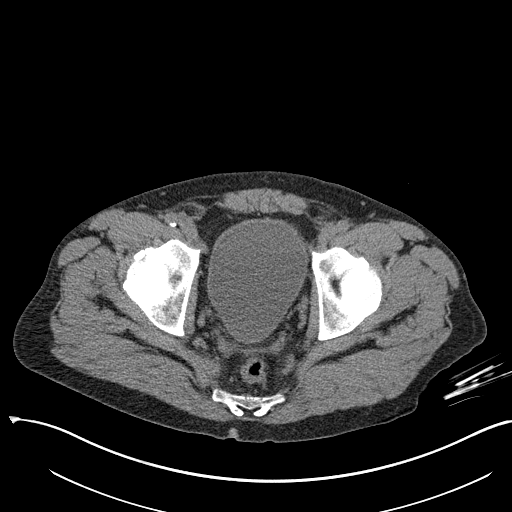
[im 10/49  soft-tissue]
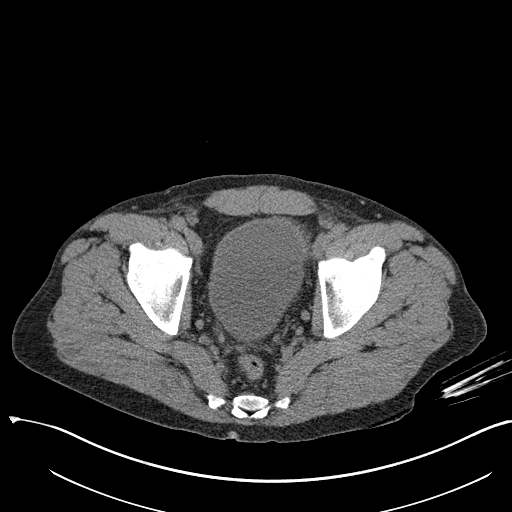
[im 14/49  soft-tissue]
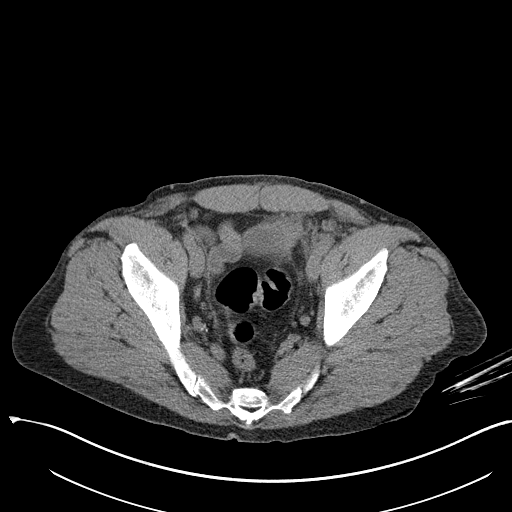
[im 17/49  soft-tissue]
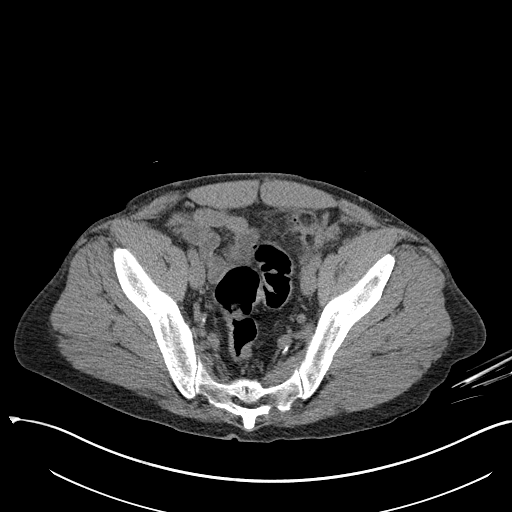
[im 21/49  soft-tissue]
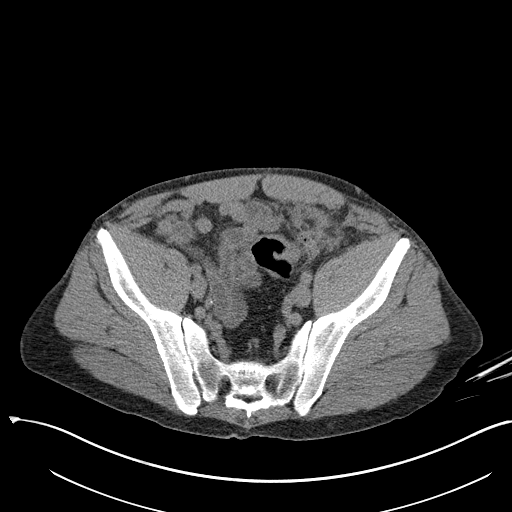
[im 26/49  soft-tissue]
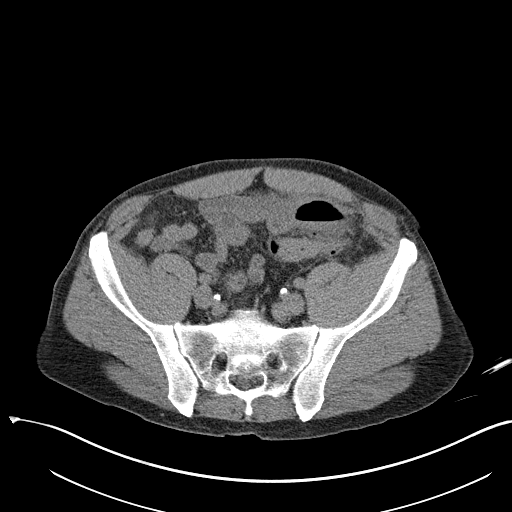
[im 28/49  soft-tissue]
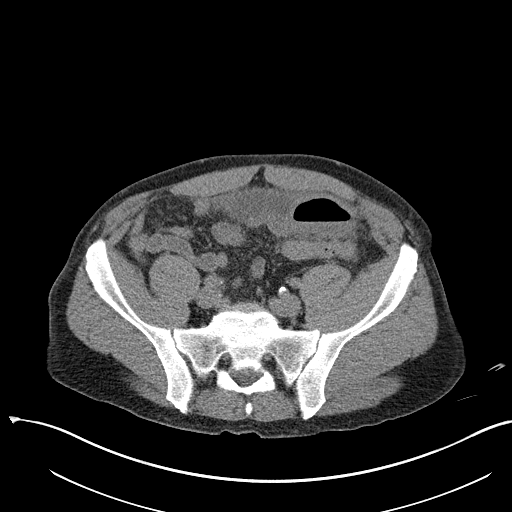
[im 33/49  soft-tissue]
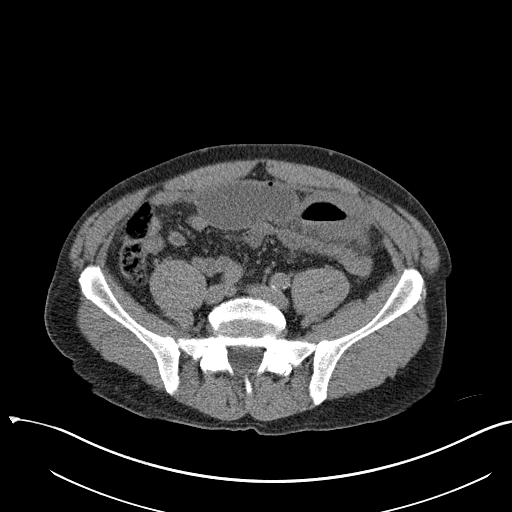
[im 33/49  bone]
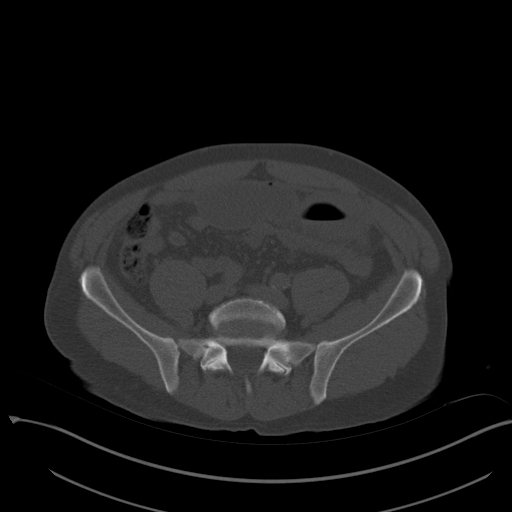
[im 35/49  soft-tissue]
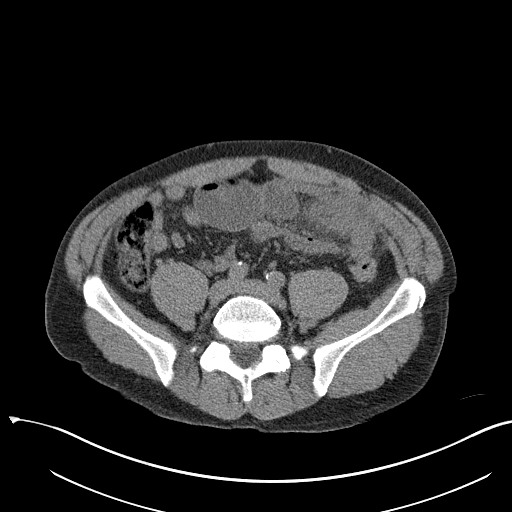
[im 39/49  soft-tissue]
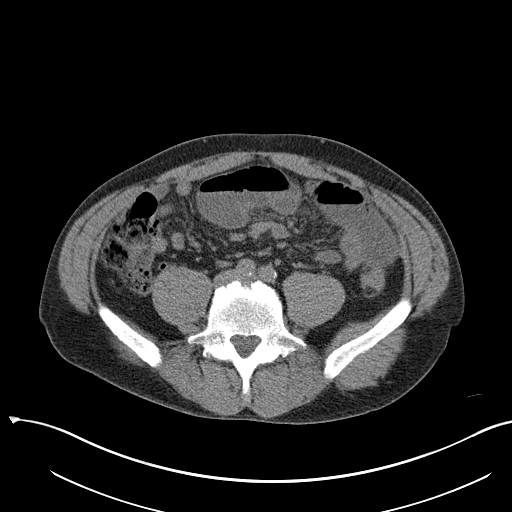
[im 39/49  lung]
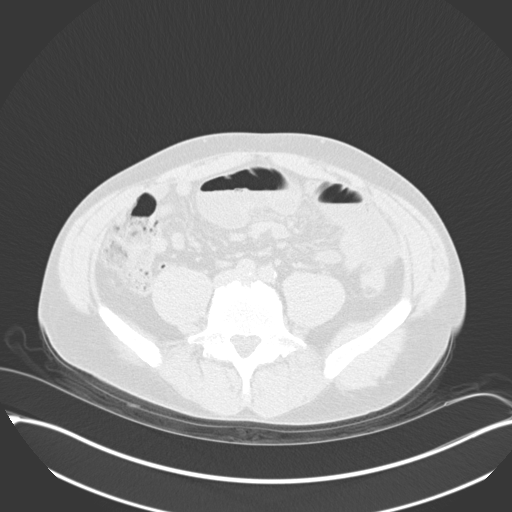
[im 42/49  soft-tissue]
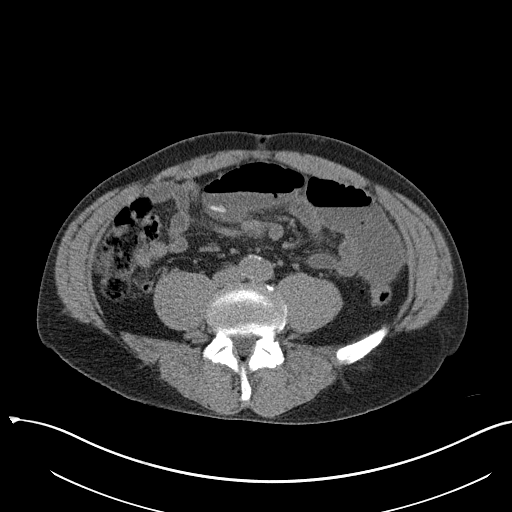
[im 42/49  lung]
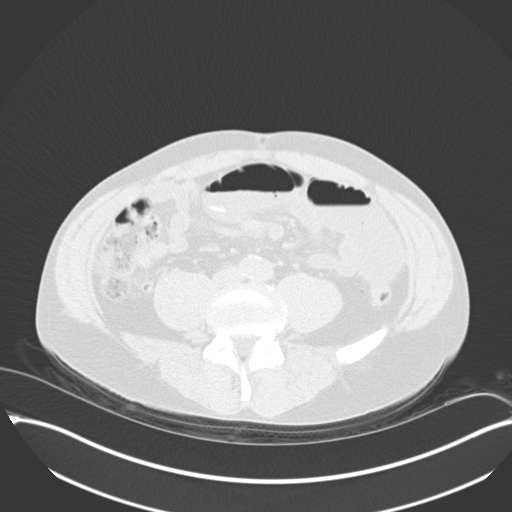
[im 44/49  lung]
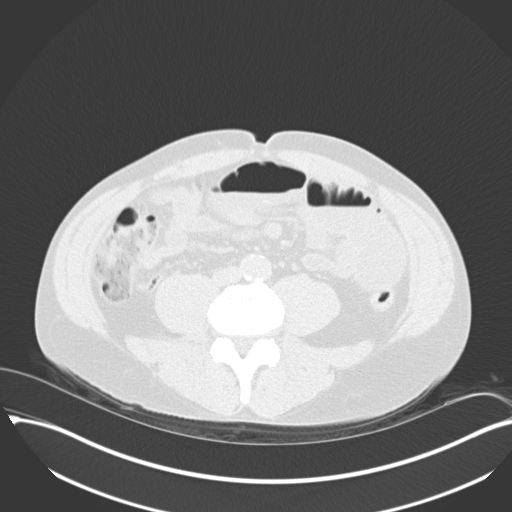
[im 46/49  soft-tissue]
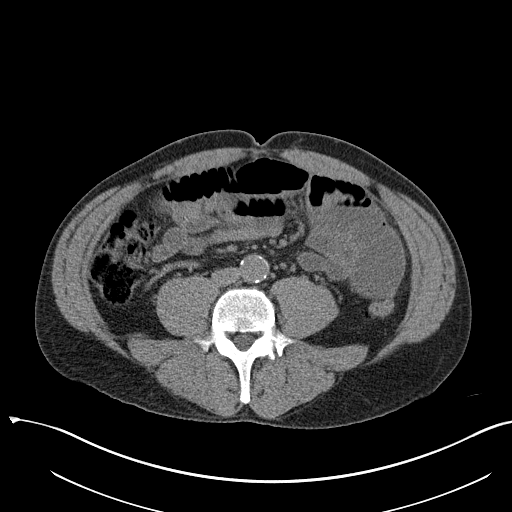
[im 46/49  lung]
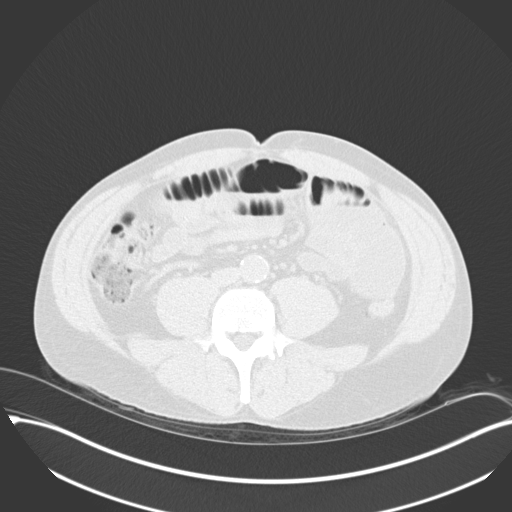

[14 of 32 positions shown; findings below may reference images not displayed]

EXAM:
CT-GUIDED LEFT LOWER QUADRANT ABSCESS DRAIN

MEDICATIONS:
The patient is currently admitted to the hospital and receiving
intravenous antibiotics. The antibiotics were administered within an
appropriate time frame prior to the initiation of the procedure.

ANESTHESIA/SEDATION:
Fentanyl 100 mcg IV; Versed 1 mg IV

Moderate Sedation Time:  10 MINUTES

The patient was continuously monitored during the procedure by the
interventional radiology nurse under my direct supervision.

COMPLICATIONS:
None immediate.

PROCEDURE:
Informed written consent was obtained from the patient after a
thorough discussion of the procedural risks, benefits and
alternatives. All questions were addressed. Maximal Sterile Barrier
Technique was utilized including caps, mask, sterile gowns, sterile
gloves, sterile drape, hand hygiene and skin antiseptic. A timeout
was performed prior to the initiation of the procedure.

Previous imaging reviewed. Patient positioned supine. Noncontrast
localization CT performed. The left lower quadrant abscess was
localized and marked for an anterior approach.

Under sterile conditions and local anesthesia, an 18 gauge needle
was introduced from an anterior approach into the abscess. Needle
position confirmed with CT. Syringe aspiration yielded purulent
fluid. Guidewire advanced and confirmed in the abscess with CT.
Tract dilatation performed to insert a 10 French drain. Drain
catheter position confirmed with CT as well. Syringe aspiration
yielded 15 cc purulent fluid. Sample sent for culture. Catheter
secured with a Prolene suture and connected to external suction
bulb. Sterile dressing applied. No immediate complication. Patient
tolerated the procedure well.
IMPRESSION: Successful CT-guided left lower quadrant abscess drain placement.

## 2022-06-25 IMAGING — CT CT ABD-PELV W/O CM
2 of 4 series · 17 of 46 positions shown, 19 images · non-contrast
Comparison: 06/16/2020

CLINICAL DATA: Abdominal pain and fever. Burning sensation when
urinating

EXAM:
CT ABDOMEN AND PELVIS WITHOUT CONTRAST
TECHNIQUE: Multidetector CT imaging of the abdomen and pelvis was performed
following the standard protocol without IV contrast.

[Series 3: ap without · axial · non-contrast · 0.85mm/px · z∈[-498,-84]mm · 14 of 93 slices shown, 16 images]
[im 5/93  soft-tissue]
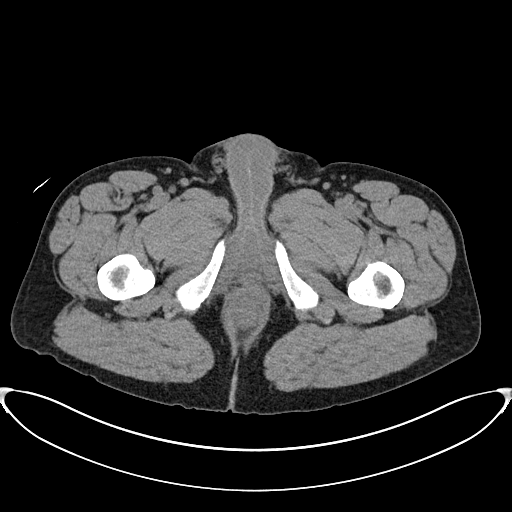
[im 5/93  bone]
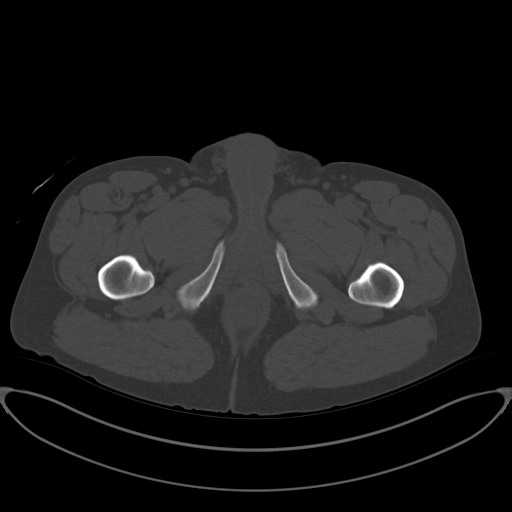
[im 10/93  soft-tissue]
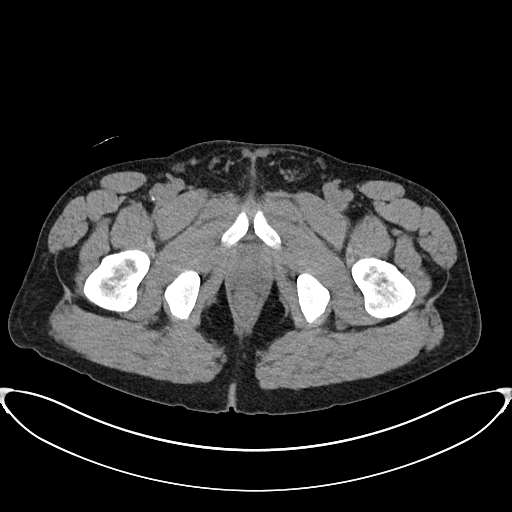
[im 20/93  soft-tissue]
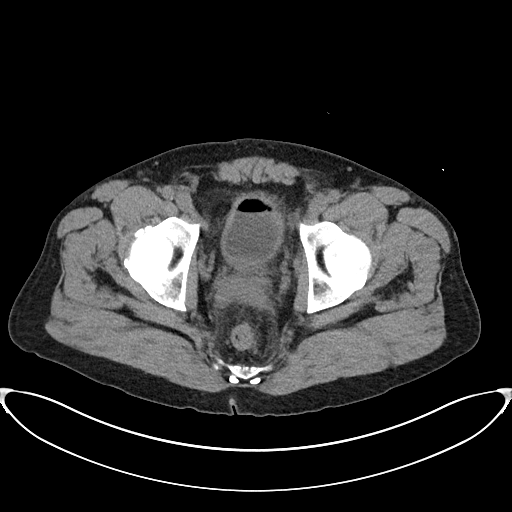
[im 25/93  soft-tissue]
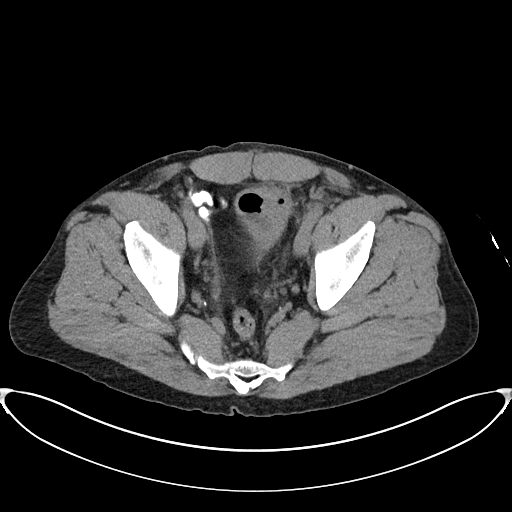
[im 30/93  soft-tissue]
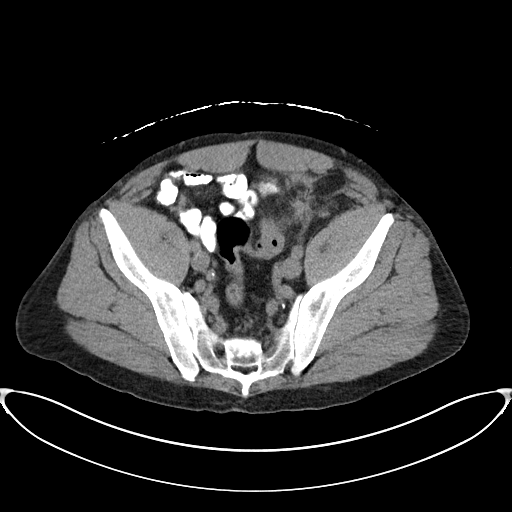
[im 39/93  soft-tissue]
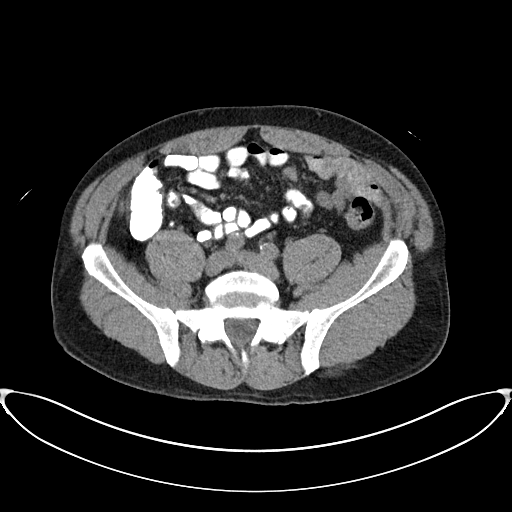
[im 44/93  soft-tissue]
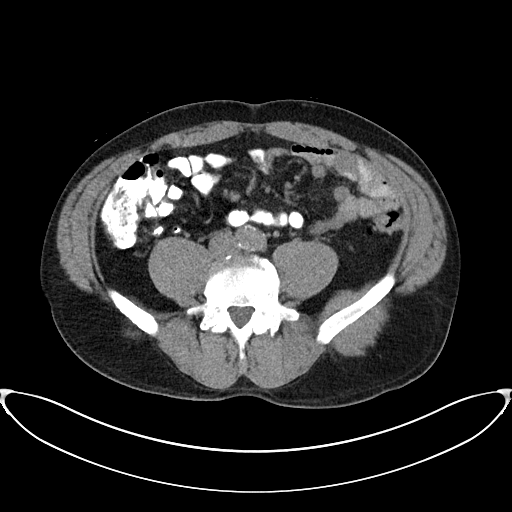
[im 49/93  soft-tissue]
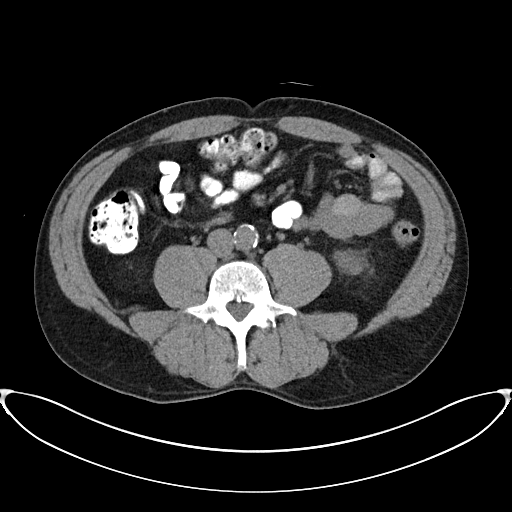
[im 54/93  soft-tissue]
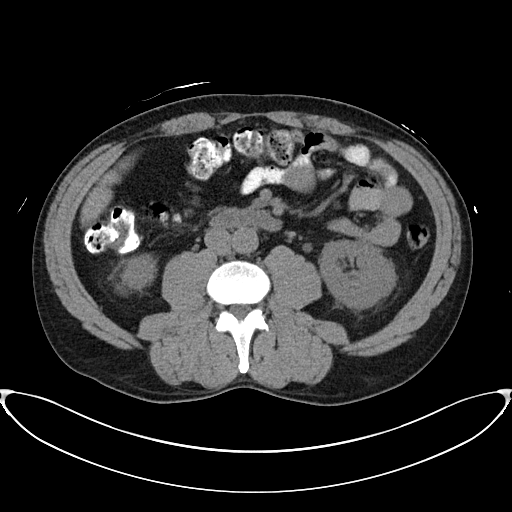
[im 54/93  bone]
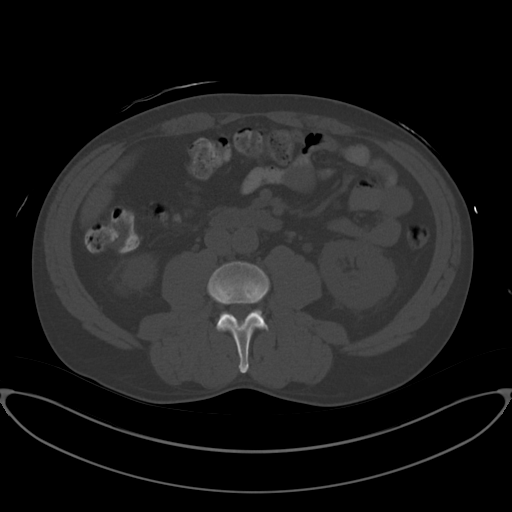
[im 63/93  soft-tissue]
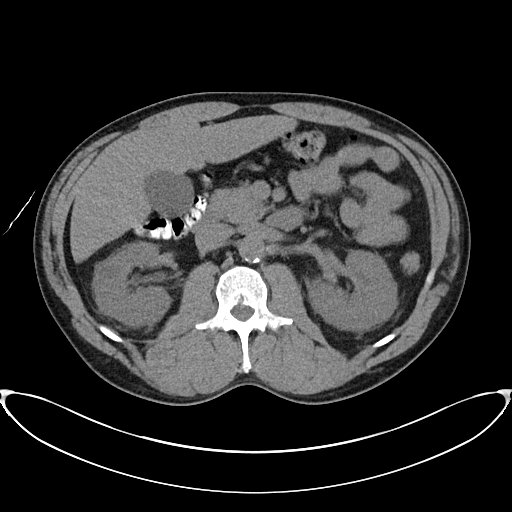
[im 68/93  soft-tissue]
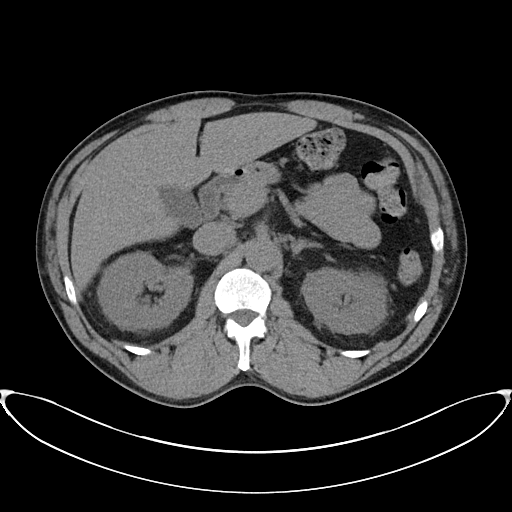
[im 73/93  soft-tissue]
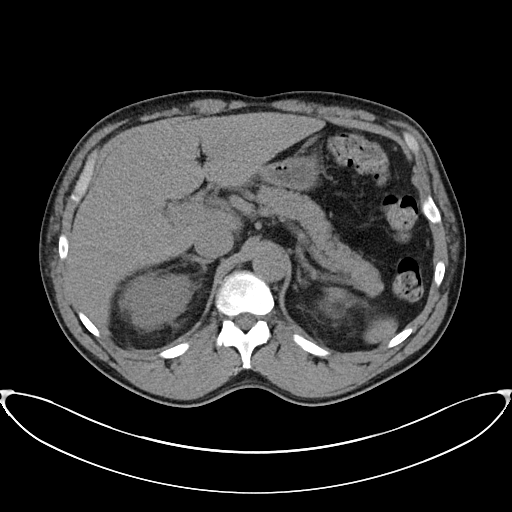
[im 83/93  soft-tissue]
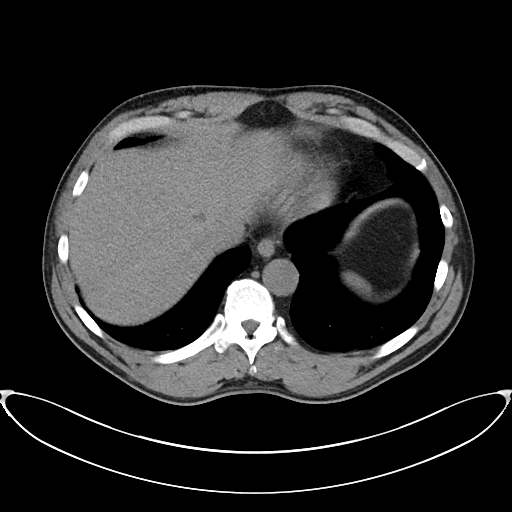
[im 88/93  soft-tissue]
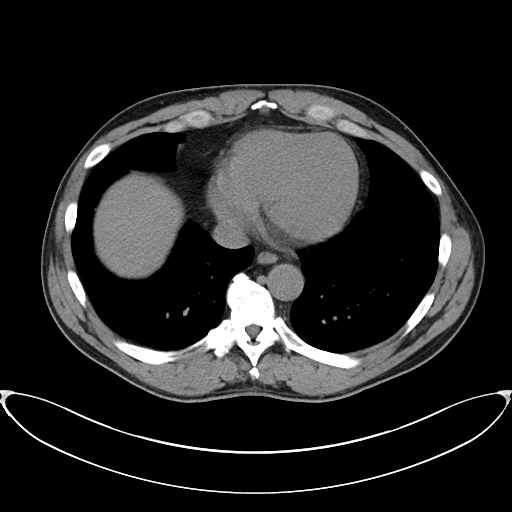

[Series 6: cor · coronal · 0.79mm/px · 3 of 96 slices shown]
[im 32/96  soft-tissue]
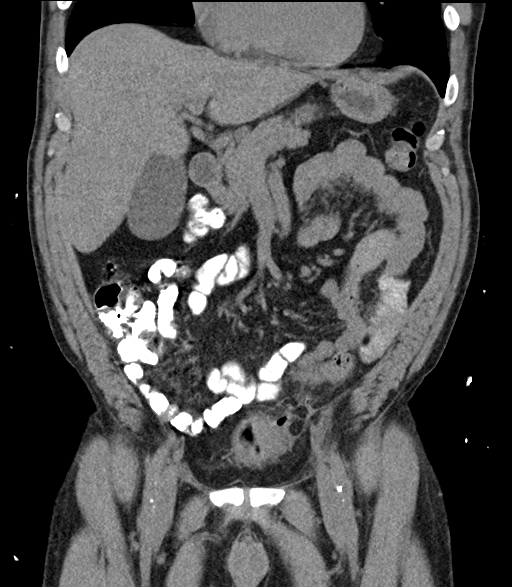
[im 43/96  soft-tissue]
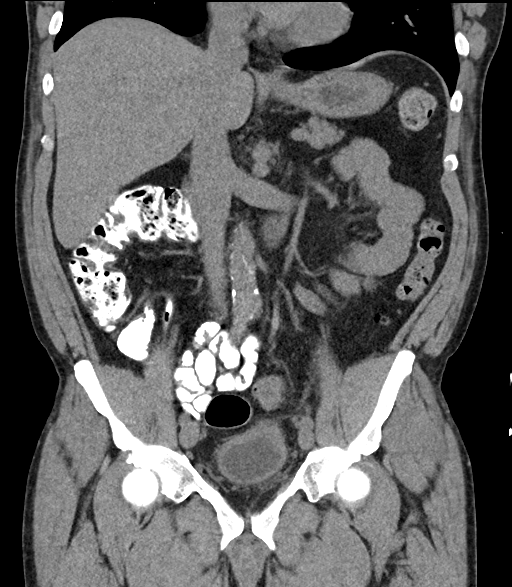
[im 53/96  soft-tissue]
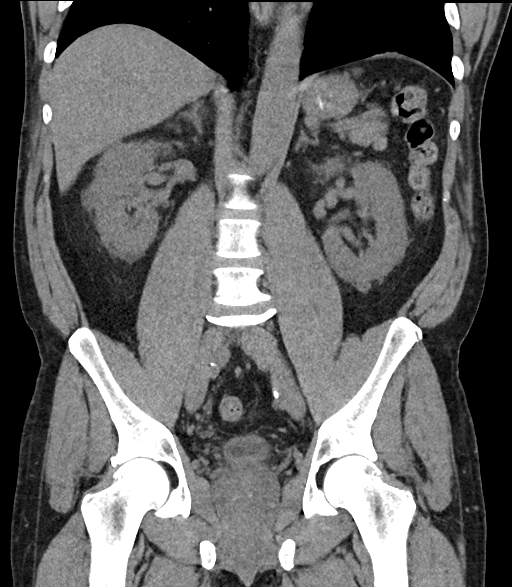

[17 of 46 positions shown; findings below may reference images not displayed]

FINDINGS: Lower chest:  No contributory findings.

Hepatobiliary: No focal liver abnormality.No evidence of biliary
obstruction or stone.

Pancreas: Unremarkable.

Spleen: Unremarkable.

Adrenals/Urinary Tract:  Negative adrenals.

Thick walled bladder containing moderate gas with a visible gas
containing channel extending superiorly and leftward from the
bladder dome towards the sigmoid colon where there was a
diverticular abscess previously. No evidence of renal infection,
perinephric stranding is chronic and symmetric. Right renal cystic
densities. No hydronephrosis.

Stomach/Bowel: Colonic diverticulosis which is overall mild. No
bowel obstruction.

Vascular/Lymphatic: No acute vascular abnormality. No mass or
adenopathy.

Reproductive:Enlarged prostate with asymmetric leftward and
posterior bulging of the capsule.

Other: No ascites or pneumoperitoneum.

Musculoskeletal: No acute abnormalities.
IMPRESSION: 1. Colovesicular fistula related to recent sigmoid diverticulitis.
Oral contrast has reached the transverse colon, consider CT or plain
film in a few hours to document contrast passage into the bladder.
2. Left posterior bulging of the enlarged prostate, please correlate
with PSA.
